# Patient Record
Sex: Female | Born: 1935 | Race: White | Hispanic: No | Smoking: Never smoker
Health system: Southern US, Community
[De-identification: ages and names within clinical notes are randomized; demographics above are authoritative.]

## PROBLEM LIST (undated history)

## (undated) DIAGNOSIS — G309 Alzheimer's disease, unspecified: Secondary | ICD-10-CM

## (undated) DIAGNOSIS — R630 Anorexia: Secondary | ICD-10-CM

## (undated) DIAGNOSIS — E785 Hyperlipidemia, unspecified: Secondary | ICD-10-CM

## (undated) DIAGNOSIS — F32A Depression, unspecified: Secondary | ICD-10-CM

## (undated) DIAGNOSIS — F419 Anxiety disorder, unspecified: Secondary | ICD-10-CM

## (undated) DIAGNOSIS — F0280 Dementia in other diseases classified elsewhere without behavioral disturbance: Secondary | ICD-10-CM

## (undated) DIAGNOSIS — I639 Cerebral infarction, unspecified: Secondary | ICD-10-CM

## (undated) DIAGNOSIS — E559 Vitamin D deficiency, unspecified: Secondary | ICD-10-CM

## (undated) DIAGNOSIS — D649 Anemia, unspecified: Secondary | ICD-10-CM

## (undated) DIAGNOSIS — K219 Gastro-esophageal reflux disease without esophagitis: Secondary | ICD-10-CM

## (undated) DIAGNOSIS — J449 Chronic obstructive pulmonary disease, unspecified: Secondary | ICD-10-CM

## (undated) DIAGNOSIS — M1990 Unspecified osteoarthritis, unspecified site: Secondary | ICD-10-CM

## (undated) DIAGNOSIS — N189 Chronic kidney disease, unspecified: Secondary | ICD-10-CM

## (undated) DIAGNOSIS — I10 Essential (primary) hypertension: Secondary | ICD-10-CM

## (undated) HISTORY — PX: KNEE SURGERY: SHX244

## (undated) HISTORY — DX: Anemia, unspecified: D64.9

## (undated) HISTORY — DX: Cerebral infarction, unspecified: I63.9

## (undated) HISTORY — DX: Chronic obstructive pulmonary disease, unspecified: J44.9

## (undated) HISTORY — PX: TONSILLECTOMY: SUR1361

## (undated) HISTORY — DX: Chronic kidney disease, unspecified: N18.9

## (undated) HISTORY — PX: ABDOMINAL HYSTERECTOMY: SHX81

---

## 2009-08-31 ENCOUNTER — Ambulatory Visit (HOSPITAL_COMMUNITY): Admission: RE | Admit: 2009-08-31 | Discharge: 2009-08-31 | Payer: Self-pay | Admitting: Unknown Physician Specialty

## 2011-08-22 ENCOUNTER — Emergency Department (HOSPITAL_COMMUNITY)
Admission: EM | Admit: 2011-08-22 | Discharge: 2011-08-22 | Disposition: A | Discharge status: Nursing Home (Includes ICF) | Payer: Medicare Other | Attending: Emergency Medicine | Admitting: Emergency Medicine

## 2011-08-22 ENCOUNTER — Emergency Department (HOSPITAL_COMMUNITY): Payer: Medicare Other

## 2011-08-22 DIAGNOSIS — I10 Essential (primary) hypertension: Secondary | ICD-10-CM | POA: Insufficient documentation

## 2011-08-22 DIAGNOSIS — G309 Alzheimer's disease, unspecified: Secondary | ICD-10-CM | POA: Insufficient documentation

## 2011-08-22 DIAGNOSIS — Y9210 Unspecified residential institution as the place of occurrence of the external cause: Secondary | ICD-10-CM | POA: Insufficient documentation

## 2011-08-22 DIAGNOSIS — W1809XA Striking against other object with subsequent fall, initial encounter: Secondary | ICD-10-CM | POA: Insufficient documentation

## 2011-08-22 DIAGNOSIS — W19XXXA Unspecified fall, initial encounter: Secondary | ICD-10-CM

## 2011-08-22 DIAGNOSIS — S0990XA Unspecified injury of head, initial encounter: Secondary | ICD-10-CM | POA: Insufficient documentation

## 2011-08-22 DIAGNOSIS — F0280 Dementia in other diseases classified elsewhere without behavioral disturbance: Secondary | ICD-10-CM | POA: Insufficient documentation

## 2011-08-22 HISTORY — DX: Alzheimer's disease, unspecified: G30.9

## 2011-08-22 HISTORY — DX: Anxiety disorder, unspecified: F41.9

## 2011-08-22 HISTORY — DX: Unspecified osteoarthritis, unspecified site: M19.90

## 2011-08-22 HISTORY — DX: Dementia in other diseases classified elsewhere, unspecified severity, without behavioral disturbance, psychotic disturbance, mood disturbance, and anxiety: F02.80

## 2011-08-22 HISTORY — DX: Hyperlipidemia, unspecified: E78.5

## 2011-08-22 HISTORY — DX: Essential (primary) hypertension: I10

## 2011-08-22 NOTE — ED Notes (Signed)
[facility] arrived. Pt to [place] in wheelchair

## 2011-08-22 NOTE — ED Notes (Signed)
Patient transported to CT 

## 2011-08-22 NOTE — ED Notes (Signed)
Pt dressed and in wheelchair, to desk to await ride to [facility]

## 2011-08-22 NOTE — ED Provider Notes (Signed)
History     CSN: [id number]  Arrival date & time 08/22/11  1821   First MD Initiated Contact with Patient 08/22/11 1828      Chief Complaint  Patient presents with  . Fall    (Consider location/radiation/quality/duration/timing/severity/associated sxs/prior treatment) Patient is a 78 y.o. female presenting with fall. The history is provided by the patient, the nursing home and the EMS personnel.  Fall The accident occurred 1 to 2 hours ago. The fall occurred while standing. The point of impact was the head. The pain is at a severity of 0/10. The patient is experiencing no pain. Pertinent negatives include no nausea and no vomiting.   Patient from Alzheimer's unit at [facility]. Unwitnessed fall the patient claimed that she fell backwards striking wall and slid to the ground and hit her head on the back of the wall. No complaints now.   Level V caveat applies due to her history of severe Alzheimer.  Past Medical History  Diagnosis Date  . Alzheimer disease   . Hypertension   . Anxiety   . Hyperlipidemia   . Osteoarthritis   . Allergic rhinitis   . Osteoporosis     Past Surgical History  Procedure Date  . Unable     No family history on file.  History  Substance Use Topics  . Smoking status: Not on file  . Smokeless tobacco: Not on file  . Alcohol Use: No    OB History    Grav Para Term Preterm Abortions TAB SAB Ect Mult Living                  Review of Systems  Unable to perform ROS Gastrointestinal: Negative for nausea and vomiting.   otherwise not able to obtain review of systems due to severe Alzheimer's.  Allergies  Review of patient's allergies indicates no known allergies.  Home Medications  No current outpatient prescriptions on file.  BP 129/88  Pulse 91  Temp(Src) 98.7 F (37.1 C) (Oral)  Resp 20  SpO2 99%  Physical Exam  Nursing note and vitals reviewed. Constitutional: She appears well-developed and well-nourished. No distress.    HENT:  Head: Normocephalic and atraumatic.  Mouth/Throat: Oropharynx is clear and moist.  Eyes: Conjunctivae are normal. Pupils are equal, round, and reactive to light.  Neck: Normal range of motion. Neck supple.  Cardiovascular: Normal rate, regular rhythm and normal heart sounds.   No murmur heard. Pulmonary/Chest: Effort normal and breath sounds normal. She has no wheezes.  Abdominal: Soft. Bowel sounds are normal. There is no tenderness.  Musculoskeletal: Normal range of motion. She exhibits no tenderness.  Neurological: She is alert.       Moving all 4 extremities  Skin: Skin is warm. No rash noted.    ED Course  Procedures (including critical care time)  Labs Reviewed - No data to display Ct Head Wo Contrast  08/22/2011  *RADIOLOGY REPORT*  Clinical Data: Fall  CT HEAD WITHOUT CONTRAST  Technique:  Contiguous axial images were obtained from the base of the skull through the vertex without contrast.  Comparison: None.  Findings: No acute intracranial hemorrhage.  No focal mass lesion. No CT evidence of acute infarction.   No midline shift or mass effect.  No hydrocephalus.  Basilar cisterns are patent.  There is generalized cortical atrophy and periventricular white matter hypodensities.  No evidence of skull fracture.  No fluid the paranasal sinuses.  IMPRESSION:  1.  No acute intracranial trauma. 2.  Atrophy and microvascular disease.  Original Report Authenticated By: Genevive Bi, M.D.     1. Fall   2. Head injury       MDM  Patient from [facility] brought in by EMS on a witnessed fall patient's stay she fell against the back wall and slid down and hit head against the wall, patient is in the Alzheimer's unit, no obvious trauma. Head CT negative. Okay to return to [facility].        Shelda Jakes, MD 08/22/11 2034

## 2011-08-22 NOTE — ED Notes (Signed)
Report called to [place] at [facility].  awating transport

## 2011-08-22 NOTE — ED Notes (Signed)
Pt from [facility] brought by EMS for ? un witnessed fall. Per pt she fell against back of wall and slid down and hit head against wall. Pt is not is LSB or C-collar. Pt alert. Pt lives on [facility].

## 2011-09-26 ENCOUNTER — Emergency Department (HOSPITAL_COMMUNITY)
Admission: EM | Admit: 2011-09-26 | Discharge: 2011-09-27 | Disposition: A | Discharge status: Nursing Home (Includes ICF) | Payer: Medicare Other | Attending: Emergency Medicine | Admitting: Emergency Medicine

## 2011-09-26 ENCOUNTER — Encounter (HOSPITAL_COMMUNITY): Payer: Self-pay | Admitting: *Deleted

## 2011-09-26 DIAGNOSIS — G309 Alzheimer's disease, unspecified: Secondary | ICD-10-CM | POA: Insufficient documentation

## 2011-09-26 DIAGNOSIS — M1990 Unspecified osteoarthritis, unspecified site: Secondary | ICD-10-CM | POA: Insufficient documentation

## 2011-09-26 DIAGNOSIS — S0990XA Unspecified injury of head, initial encounter: Secondary | ICD-10-CM

## 2011-09-26 DIAGNOSIS — F0390 Unspecified dementia without behavioral disturbance: Secondary | ICD-10-CM | POA: Insufficient documentation

## 2011-09-26 DIAGNOSIS — S01311A Laceration without foreign body of right ear, initial encounter: Secondary | ICD-10-CM

## 2011-09-26 DIAGNOSIS — W19XXXA Unspecified fall, initial encounter: Secondary | ICD-10-CM | POA: Insufficient documentation

## 2011-09-26 DIAGNOSIS — M810 Age-related osteoporosis without current pathological fracture: Secondary | ICD-10-CM | POA: Insufficient documentation

## 2011-09-26 DIAGNOSIS — S01309A Unspecified open wound of unspecified ear, initial encounter: Secondary | ICD-10-CM | POA: Insufficient documentation

## 2011-09-26 DIAGNOSIS — R4182 Altered mental status, unspecified: Secondary | ICD-10-CM | POA: Insufficient documentation

## 2011-09-26 DIAGNOSIS — I10 Essential (primary) hypertension: Secondary | ICD-10-CM | POA: Insufficient documentation

## 2011-09-26 DIAGNOSIS — F411 Generalized anxiety disorder: Secondary | ICD-10-CM | POA: Insufficient documentation

## 2011-09-26 DIAGNOSIS — Y9210 Unspecified residential institution as the place of occurrence of the external cause: Secondary | ICD-10-CM | POA: Insufficient documentation

## 2011-09-26 DIAGNOSIS — S0003XA Contusion of scalp, initial encounter: Secondary | ICD-10-CM | POA: Insufficient documentation

## 2011-09-26 DIAGNOSIS — F0280 Dementia in other diseases classified elsewhere without behavioral disturbance: Secondary | ICD-10-CM | POA: Insufficient documentation

## 2011-09-26 DIAGNOSIS — E785 Hyperlipidemia, unspecified: Secondary | ICD-10-CM | POA: Insufficient documentation

## 2011-09-26 NOTE — ED Notes (Signed)
Per EMS - pt from [facility] - pt reported to have an unwitnessed fall this evening - pt w/ rt ear laceration w/ unknown mechanism of injury - pt w/ hx of dementia and is a poor historian to events. Pt alert on arrival in no acute distress and denies any pain at present.

## 2011-09-27 ENCOUNTER — Emergency Department (HOSPITAL_COMMUNITY): Payer: Medicare Other

## 2011-09-27 LAB — URINALYSIS, ROUTINE W REFLEX MICROSCOPIC
Glucose, UA: NEGATIVE mg/dL
Urobilinogen, UA: 0.2 mg/dL (ref 0.0–1.0)

## 2011-09-27 LAB — URINE MICROSCOPIC-ADD ON

## 2011-09-27 NOTE — ED Provider Notes (Signed)
History     CSN: [id number]  Arrival date & time 09/26/11  2222   First MD Initiated Contact with Patient 09/26/11 2350      Chief Complaint  Patient presents with  . Fall  . Ear Laceration     Patient is a 78 y.o. female presenting with fall. The history is provided by the patient, the nursing home and the EMS personnel. History Limited By: dementia.  Fall   patient presents from the assisted living facility after having a reported fall.  She was sent because of a laceration to her right year.  The patient has a history of dementia however the patient reports to me that she tripped over the side of her bed and struck her right year.  She denies headache.  She denies weakness of her arms or her legs.  She denies neck pain.  At this time she is without complaints.  She does not even complain of pain in her right year.  Past Medical History  Diagnosis Date  . Alzheimer disease   . Hypertension   . Anxiety   . Hyperlipidemia   . Osteoarthritis   . Allergic rhinitis   . Osteoporosis    Medications acetaminophen (QC ARTHRITIS PAIN RELIEF) 650 MG CR tablet Take 650 mg by mouth 3 (three) times daily as needed. For pain Historical Provider, MD aspirin EC 81 MG tablet Take 81 mg by mouth daily. Historical Provider, MD atorvastatin (LIPITOR) 10 MG tablet Take 10 mg by mouth daily. Historical Provider, MD Calcium Citrate-Vitamin D 250-200 MG-UNIT TABS Take 2 tablets by mouth 3 (three) times daily. Historical Provider, MD donepezil (ARICEPT) 10 MG tablet Take 10 mg by mouth at bedtime as needed. Historical Provider, MD lisinopril (PRINIVIL,ZESTRIL) 40 MG tablet Take 40 mg by mouth daily. Historical Provider, MD LORazepam (ATIVAN) 1 MG tablet Take 0.5 mg by mouth at bedtime. Historical Provider, MD Lycopene 10 MG CAPS Take 1 capsule by mouth daily. Historical Provider, MD mirtazapine (REMERON) 15 MG tablet Take 15 mg by mouth at bedtime. Historical Provider, MD risperiDONE (RISPERDAL) 0.5 MG tablet  Take 0.5 mg by mouth at bedtime. Historical Provider, MD  Past Surgical History  Procedure Date  . Unable     No family history on file.  History  Substance Use Topics  . Smoking status: Unknown If Ever Smoked  . Smokeless tobacco: Not on file  . Alcohol Use: No    OB History    Grav Para Term Preterm Abortions TAB SAB Ect Mult Living                  Review of Systems  Unable to perform ROS   Allergies  Review of patient's allergies indicates no known allergies.  Home Medications     BP 144/76  Pulse 83  Temp(Src) 98.8 F (37.1 C) (Oral)  Resp 16  Ht 5' (1.524 m)  Wt 125 lb (56.7 kg)  BMI 24.41 kg/m2  SpO2 93%  Physical Exam  Nursing note and vitals reviewed. Constitutional: She appears well-developed and well-nourished. No distress.  HENT:  Head: Normocephalic and atraumatic.       Laceration to right superior helix and antihelix both on the anterior and posterior aspect.  This does not appear to be a through and through laceration.  There is no active bleeding.  There is a small hematoma of the superior anterior area of the antihelix and scaphoid fossa.  Her right external ear is otherwise normal.  She has  no mastoid tenderness or bruising.  Eyes: EOM are normal.  Neck: Normal range of motion. Neck supple.  Cardiovascular: Normal rate, regular rhythm and normal heart sounds.   Pulmonary/Chest: Effort normal and breath sounds normal.  Abdominal: Soft. She exhibits no distension. There is no tenderness.  Musculoskeletal: Normal range of motion.  Neurological: She is alert.       Oriented to person and place.  She does not know the year nor the month  Skin: Skin is warm and dry.  Psychiatric: She has a normal mood and affect. Judgment normal.    ED Course  Procedures (including critical care time)  LACERATION REPAIR Performed by: Lyanne Co Consent: Verbal consent obtained. Risks and benefits: risks, benefits and alternatives were  discussed Patient identity confirmed: provided demographic data Time out performed prior to procedure Prepped and Draped in normal sterile fashion Wound explored  Laceration Location: Right external ear Laceration Length: 3 cm No Foreign Bodies seen or palpated Anesthesia: None  Irrigation method: syringe Amount of cleaning: standard Skin closure: Tissue adhesive  Technique: Tissue adhesive  Patient tolerance: Patient tolerated the procedure well with no immediate complications.   Labs Reviewed  URINALYSIS, ROUTINE W REFLEX MICROSCOPIC - Abnormal; Notable for the following:    Hgb urine dipstick TRACE (*)    Leukocytes, UA TRACE (*)    All other components within normal limits  URINE MICROSCOPIC-ADD ON - Abnormal; Notable for the following:    Bacteria, UA FEW (*)    Casts HYALINE CASTS (*) GRANULAR CAST   All other components within normal limits  URINE CULTURE   Ct Head Wo Contrast  09/27/2011  *RADIOLOGY REPORT*  Clinical Data: Unwitnessed fall at the nursing home.  Laceration to the right ear.  History of Alzheimer's disease.  CT HEAD WITHOUT CONTRAST  Technique:  Contiguous axial images were obtained from the base of the skull through the vertex without contrast.  Comparison: CT head without contrast 08/22/2011.  Findings: Atrophy and moderate white matter disease is stable.  No acute cortical infarct, hemorrhage, mass lesion is present.  Remote lacunar infarcts are present within the cerebellum bilaterally. The ventricles are of normal size.  No significant extra-axial fluid collection is present.  The paranasal sinuses and mastoid air cells are clear.  The osseous skull is intact.  No significant extracranial soft tissue injury is evident.  IMPRESSION:  1.  Stable atrophy and white matter disease. 2.  No acute intracranial abnormality.  Original Report Authenticated By: Jamesetta Orleans. MATTERN, M.D.     1. Fall   2. Laceration of right external ear   3. Minor head injury        MDM  Patient with laceration to her right ear.  This was repaired with Dermabond.  There is a small hematoma of the right auricle however given her history of dementia and her age I don't think she will tolerate significant dressing well.  She is at slight increased risk for deformity of her right year however I think that the risk of placing a bolster dressing is greater than the benefit.  Head CT negative.  Urinalysis normal.  DC home in good condition.        Lyanne Co, MD 09/27/11 [id number]

## 2011-09-27 NOTE — ED Notes (Signed)
Called [phone] and notified of pt discharge. Holly at [facility] will be here shortly to pick pt up.

## 2011-09-27 NOTE — Discharge Instructions (Signed)
Auricle Injuries You have an injury to your external ear (auricle). The ear has a layer of skin over cartilage. A cut or bruise to the ear can separate the skin from the cartilage underneath. This can cause problems with healing if blood gathers between the skin and the cartilage. Permanent damage to the ear may result if the excess blood is not drained within 1 to 2 days. Stitches, tape, or tissue glue may be used to close a cut. A pressure bandage may be used to keep blood from forming under the injured skin. If there is a lot of blood present (hematoma), a needle aspiration may be needed to remove it. You must have the ear checked within 1 to 2 days or as directed if you have had this type of injury. This is see if the blood has accumulated again. Call your caregiver for a follow-up exam as recommended.  SEEK IMMEDIATE MEDICAL CARE IF:  You develop severe pain.   You develop a fever or pus like drainage.   You have increased hearing loss or other problems.  MAKE SURE YOU:   Understand these instructions.   Will watch your condition.   Will get help right away if you are not doing well or get worse.  Document Released: 08/01/2005 Document Revised: 04/13/2011 Document Reviewed: 01/18/2007 [facility] Patient Information 2012 [place], [place].

## 2011-09-27 NOTE — ED Notes (Signed)
Discharge instructions reviewed w/ Fleet Contras, pt's caregiver from [facility] Denies any further questions or concerns at present.

## 2011-09-29 LAB — URINE CULTURE
Colony Count: >=100000
Culture  Setup Time: 201302121438

## 2011-09-30 NOTE — ED Notes (Signed)
+   Urine Chart sent to EDP office for review; no discharge orders

## 2011-10-03 NOTE — ED Notes (Signed)
Chart returned from EDP office. Prescribed Macrobid 100 mg BID X 5 days. Prescribed by Levy Sjogren NP.

## 2011-10-05 NOTE — ED Notes (Signed)
Phone number not in service. Letter sent to EDP address.

## 2011-11-19 ENCOUNTER — Other Ambulatory Visit: Payer: Self-pay

## 2011-11-19 ENCOUNTER — Emergency Department (HOSPITAL_COMMUNITY): Payer: Medicare Other

## 2011-11-19 ENCOUNTER — Inpatient Hospital Stay (HOSPITAL_COMMUNITY)
Admission: EM | Admit: 2011-11-19 | Discharge: 2011-11-20 | DRG: 684 | Disposition: A | Discharge status: Skilled Nursing Facility | Payer: Medicare Other | Attending: Internal Medicine | Admitting: Internal Medicine

## 2011-11-19 DIAGNOSIS — N179 Acute kidney failure, unspecified: Principal | ICD-10-CM | POA: Diagnosis present

## 2011-11-19 DIAGNOSIS — R079 Chest pain, unspecified: Secondary | ICD-10-CM | POA: Diagnosis present

## 2011-11-19 DIAGNOSIS — M1990 Unspecified osteoarthritis, unspecified site: Secondary | ICD-10-CM | POA: Diagnosis present

## 2011-11-19 DIAGNOSIS — F0280 Dementia in other diseases classified elsewhere without behavioral disturbance: Secondary | ICD-10-CM | POA: Diagnosis present

## 2011-11-19 DIAGNOSIS — G309 Alzheimer's disease, unspecified: Secondary | ICD-10-CM | POA: Diagnosis present

## 2011-11-19 DIAGNOSIS — M810 Age-related osteoporosis without current pathological fracture: Secondary | ICD-10-CM | POA: Diagnosis present

## 2011-11-19 DIAGNOSIS — I10 Essential (primary) hypertension: Secondary | ICD-10-CM | POA: Diagnosis present

## 2011-11-19 DIAGNOSIS — E785 Hyperlipidemia, unspecified: Secondary | ICD-10-CM | POA: Diagnosis present

## 2011-11-19 DIAGNOSIS — E860 Dehydration: Secondary | ICD-10-CM | POA: Diagnosis present

## 2011-11-19 LAB — DIFFERENTIAL
Eosinophils Absolute: 0.2 10*3/uL (ref 0.0–0.7)
Lymphocytes Relative: 28 % (ref 12–46)
Lymphs Abs: 2.2 10*3/uL (ref 0.7–4.0)
Monocytes Relative: 9 % (ref 3–12)
Neutrophils Relative %: 61 % (ref 43–77)

## 2011-11-19 LAB — PROTIME-INR: INR: 1.16 (ref 0.00–1.49)

## 2011-11-19 LAB — CBC
Hemoglobin: 9.7 g/dL — ABNORMAL LOW (ref 12.0–15.0)
MCH: 31.3 pg (ref 26.0–34.0)
MCHC: 31.9 g/dL (ref 30.0–36.0)
RBC: 3.1 MIL/uL — ABNORMAL LOW (ref 3.87–5.11)
RDW: 15.7 % — ABNORMAL HIGH (ref 11.5–15.5)
WBC: 8.1 10*3/uL (ref 4.0–10.5)

## 2011-11-19 LAB — CARDIAC PANEL(CRET KIN+CKTOT+MB+TROPI): CK, MB: 4 ng/mL (ref 0.3–4.0)

## 2011-11-19 LAB — COMPREHENSIVE METABOLIC PANEL
ALT: 12 U/L (ref 0–35)
Chloride: 103 mEq/L (ref 96–112)
Creatinine, Ser: 2.13 mg/dL — ABNORMAL HIGH (ref 0.50–1.10)
GFR calc Af Amer: 23 mL/min — ABNORMAL LOW (ref >90)
GFR calc non Af Amer: 20 mL/min — ABNORMAL LOW (ref >90)
Total Bilirubin: 0.1 mg/dL — ABNORMAL LOW (ref 0.3–1.2)
Total Protein: 7 g/dL (ref 6.0–8.3)

## 2011-11-19 LAB — D-DIMER, QUANTITATIVE: D-Dimer, Quant: 2.6 ug/mL-FEU — ABNORMAL HIGH (ref 0.00–0.48)

## 2011-11-19 MED ORDER — SODIUM CHLORIDE 0.9 % IV BOLUS (SEPSIS)
500.0000 mL | Freq: Once | INTRAVENOUS | Status: AC
  Administered 2011-11-19: 500 mL via INTRAVENOUS

## 2011-11-19 MED ORDER — SODIUM CHLORIDE 0.9 % IV SOLN
Freq: Once | INTRAVENOUS | Status: AC
  Administered 2011-11-19: 22:00:00 via INTRAVENOUS

## 2011-11-19 NOTE — H&P (Addendum)
PCP:   Almond Lint M.D.   CODE STATUS:  DO NOT RESUSCITATE   Chief Complaint:  Chest pain this evening  HPI: Joanna Mendez is an 78 y.o. female.  Elderly demented Caucasian lady a resident of [facility] assisted [facility] facility, was brought to the emergency room with a history of chest pain lasting about 15 minutes. Reportedly complained of chest pain at the facility and also to EMS, but currently denies any chest pain or any history of chest pain. However because of the patient's dementia the history is not reliable.  [name] that she chest pain was apparently left-sided and radiated through to her back; there is no record of fever dizziness,diaphoresis, cough, cold, nausea or vomiting.  She has no history of kidney disease does have a history of hypertension and hyperlipidemia. Her medications include an ACE inhibitor but there is no record of her diuretic. She denies being thirsty.  A d-dimer was done because of her complaint of chest pain, and it is elevated and there is some concern for pulmonary embolus.   Rewiew of Systems:  Review of systems is unreliable because of the patient's dementia.  The patient denies anorexia, fever, weight loss,, vision loss, decreased hearing, hoarseness, chest pain, syncope, dyspnea on exertion, peripheral edema, balance deficits, hemoptysis, abdominal pain, melena, hematochezia, severe indigestion/heartburn, hematuria, genital sores, muscle weakness, suspicious skin lesions, transient blindness, difficulty walking, depression, unusual weight change, abnormal bleeding, enlarged lymph nodes, angioedema, and breast masses.    Past Medical History  Diagnosis Date  . Alzheimer disease   . Hypertension   . Anxiety   . Hyperlipidemia   . Osteoarthritis   . Allergic rhinitis   . Osteoporosis     Past Surgical History  Procedure Date  . Unable     Medications:  HOME MEDS: Prior to Admission medications   Medication Sig Start Date End Date  Taking? Authorizing Provider  acetaminophen (QC ARTHRITIS PAIN RELIEF) 650 MG CR tablet Take 650 mg by mouth 3 (three) times daily as needed. For pain    Historical Provider, MD  aspirin EC 81 MG tablet Take 81 mg by mouth daily.    Historical Provider, MD  atorvastatin (LIPITOR) 10 MG tablet Take 10 mg by mouth daily.    Historical Provider, MD  Calcium Citrate-Vitamin D 250-200 MG-UNIT TABS Take 2 tablets by mouth 3 (three) times daily.    Historical Provider, MD  donepezil (ARICEPT) 10 MG tablet Take 10 mg by mouth at bedtime as needed.    Historical Provider, MD  lisinopril (PRINIVIL,ZESTRIL) 40 MG tablet Take 40 mg by mouth daily.    Historical Provider, MD  LORazepam (ATIVAN) 1 MG tablet Take 0.5 mg by mouth at bedtime.    Historical Provider, MD  Lycopene 10 MG CAPS Take 1 capsule by mouth daily.    Historical Provider, MD  mirtazapine (REMERON) 15 MG tablet Take 15 mg by mouth at bedtime.    Historical Provider, MD  risperiDONE (RISPERDAL) 0.5 MG tablet Take 0.5 mg by mouth at bedtime.    Historical Provider, MD  UNABLE TO FIND Take 1 Can by mouth 3 (three) times daily. Med Name: MIGHTY SHAKE    Historical Provider, MD     Allergies:  No Known Allergies  Social History:   has an unknown smoking status. She does not have any smokeless tobacco history on file. She reports that she does not drink alcohol or use illicit drugs.  Family History: No family history on file.  Unable to obtain due to patient's dementia  Physical Exam: Filed Vitals:   11/19/11 2008 11/19/11 2100  BP: 125/77 122/77  Pulse: 93 82  Temp: 98.7 F (37.1 C)   TempSrc: Oral   Resp: 16 19  Height: 5\' 4"  (1.626 m)   Weight: 58.968 kg (130 lb)   SpO2: 96% 97%   Blood pressure 122/77, pulse 82, temperature 98.7 F (37.1 C), temperature source Oral, resp. rate 19, height 5\' 4"  (1.626 m), weight 58.968 kg (130 lb), SpO2 97.00%.  GEN: Pleasantly demented elderly Caucasian lady lying in the stretcher in no  acute distress; cooperative with exam PSYCH:  alert and oriented x1; does not appear anxious or depressed; affect is appropriate. HEENT: Mucous membranes pink and anicteric; PERRLA; EOM intact; no cervical lymphadenopathy nor thyromegaly or ; no JVD; Breasts:: Not examined CHEST WALL: No tenderness CHEST: Normal respiration, clear to auscultation bilaterally HEART: Regular rate and rhythm; no murmurs rubs or gallops BACK: no CVA tenderness ABDOMEN: soft non-tender; no masses, no organomegaly, normal abdominal bowel sounds; n no intertriginous candida. Rectal Exam: Not done EXTREMITIES:  age-appropriate arthropathy of the hands and knees; no edema; no ulcerations. Genitalia: not examined PULSES: 2+ and symmetric SKIN: Normal hydration no rash or ulceration CNS: Cranial nerves 2-12 grossly intact no focal lateralizing neurologic deficit   Labs & Imaging Results for orders placed during the hospital encounter of 11/19/11 (from the past 48 hour(s))  CARDIAC PANEL(CRET KIN+CKTOT+MB+TROPI)     Status: Normal   Collection Time   11/19/11  8:21 PM      Component Value Range Comment   Total CK 87  7 - 177 (U/L)    CK, MB 4.0  0.3 - 4.0 (ng/mL)    Troponin I <0.30  <0.30 (ng/mL)    Relative Index RELATIVE INDEX IS INVALID  0.0 - 2.5    CBC     Status: Abnormal   Collection Time   11/19/11  8:21 PM      Component Value Range Comment   WBC 8.1  4.0 - 10.5 (K/uL)    RBC 3.10 (*) 3.87 - 5.11 (MIL/uL)    Hemoglobin 9.7 (*) 12.0 - 15.0 (g/dL)    HCT 98.1 (*) 19.1 - 46.0 (%)    MCV 98.1  78.0 - 100.0 (fL)    MCH 31.3  26.0 - 34.0 (pg)    MCHC 31.9  30.0 - 36.0 (g/dL)    RDW 47.8 (*) 29.5 - 15.5 (%)    Platelets 307  150 - 400 (K/uL)   DIFFERENTIAL     Status: Normal   Collection Time   11/19/11  8:21 PM      Component Value Range Comment   Neutrophils Relative 61  43 - 77 (%)    Neutro Abs 4.9  1.7 - 7.7 (K/uL)    Lymphocytes Relative 28  12 - 46 (%)    Lymphs Abs 2.2  0.7 - 4.0 (K/uL)     Monocytes Relative 9  3 - 12 (%)    Monocytes Absolute 0.7  0.1 - 1.0 (K/uL)    Eosinophils Relative 2  0 - 5 (%)    Eosinophils Absolute 0.2  0.0 - 0.7 (K/uL)    Basophils Relative 0  0 - 1 (%)    Basophils Absolute 0.0  0.0 - 0.1 (K/uL)   COMPREHENSIVE METABOLIC PANEL     Status: Abnormal   Collection Time   11/19/11  8:21 PM  Component Value Range Comment   Sodium 139  135 - 145 (mEq/L)    Potassium 4.7  3.5 - 5.1 (mEq/L)    Chloride 103  96 - 112 (mEq/L)    CO2 26  19 - 32 (mEq/L)    Glucose, Bld 97  70 - 99 (mg/dL)    BUN 42 (*) 6 - 23 (mg/dL)    Creatinine, Ser 5.78 (*) 0.50 - 1.10 (mg/dL)    Calcium 46.9 (*) 8.4 - 10.5 (mg/dL)    Total Protein 7.0  6.0 - 8.3 (g/dL)    Albumin 3.3 (*) 3.5 - 5.2 (g/dL)    AST 21  0 - 37 (U/L)    ALT 12  0 - 35 (U/L)    Alkaline Phosphatase 65  39 - 117 (U/L)    Total Bilirubin 0.1 (*) 0.3 - 1.2 (mg/dL)    GFR calc non Af Amer 20 (*) >90 (mL/min)    GFR calc Af Amer 23 (*) >90 (mL/min)   PROTIME-INR     Status: Normal   Collection Time   11/19/11  8:21 PM      Component Value Range Comment   Prothrombin Time 15.0  11.6 - 15.2 (seconds)    INR 1.16  0.00 - 1.49    D-DIMER, QUANTITATIVE     Status: Abnormal   Collection Time   11/19/11  8:21 PM      Component Value Range Comment   D-Dimer, Quant 2.60 (*) 0.00 - 0.48 (ug/mL-FEU)   LIPASE, BLOOD     Status: Abnormal   Collection Time   11/19/11  8:21 PM      Component Value Range Comment   Lipase 75 (*) 11 - 59 (U/L)    Dg Chest Portable 1 View  11/19/2011  *RADIOLOGY REPORT*  Clinical Data: Chest pain  PORTABLE CHEST - 1 VIEW  Comparison: None.  Findings: Heart size upper normal.  Aorta is uncoiled and atherosclerotic.  Negative for heart failure.  Lungs are clear without infiltrate or mass.  IMPRESSION: No active cardiopulmonary disease.  Original Report Authenticated By: Camelia Phenes, M.D.    EKG shows Q waves in leads 3, and large S waves in aVF, and marked left axis deviation.     Assessment Present on Admission:  .Chest pain, by her report  .Acute renal failure, resumed from history possibility of chronic kidney disease  .HTN (hypertension) .Hyperlipidemia .Dementia   PLAN:  We'll admit this lady her hydration and monitor her renal function Check FENa Discontinue lisinopril for the time being and institute when necessary blood pressure medication  The lack of tachycardia hypoxia and hypotension [place] embolus unlikely.  Will cycle her cardiac enzymes and have when necessary nitrates available in case of chest pain  If acute cardiac disease is ruled out, and patient's renal function is improving, she can likely be discharged home for outpatient management.   Other plans as per orders.   Karen Huhta 11/19/2011, 10:22 PM

## 2011-11-19 NOTE — ED Notes (Signed)
Patient was complaining of chest pain at nursing home, also complained of central chest pain and pressure to ems. Patient has history of Alzheimers. Received four asa pta.

## 2011-11-19 NOTE — ED Provider Notes (Signed)
History    Scribed for Glynn Octave, MD, the patient was seen in room APA08/APA08. This chart was scribed by Katha Cabal.   CSN: [id number]  Arrival date & time 11/19/11  2004   First MD Initiated Contact with Patient 11/19/11 2015      Chief Complaint  Patient presents with  . Chest Pain    (Consider location/radiation/quality/duration/timing/severity/associated sxs/prior treatment) HPI  Pt was seen at 8:19 PM  Level V Caveat applies for dementia.    Joanna Mendez is a 78 y.o. female who presents to the Emergency Department complaining of substernal chest pain today.  Pain lasted about 15 minutes. No chest pain currently.  Pain does not radiate to back.  Patient was resting when pain started.  Denies vomiting, diaphoresis or shortness of breath.   Patient brought in by EMS from SNF.  Patient took 4 ASA prior to arrival.  Symptoms are not associated with abdominal pain or back pain.   Patient with history of Alzheimers disease , hypertension and hyperlipidemia.      PCP No primary provider on file.      Past Medical History  Diagnosis Date  . Alzheimer disease   . Hypertension   . Anxiety   . Hyperlipidemia   . Osteoarthritis   . Allergic rhinitis   . Osteoporosis     Past Surgical History  Procedure Date  . Unable     No family history on file.  History  Substance Use Topics  . Smoking status: Unknown If Ever Smoked  . Smokeless tobacco: Not on file  . Alcohol Use: No    OB History    Grav Para Term Preterm Abortions TAB SAB Ect Mult Living                  Review of Systems  Unable to perform ROS: Dementia    Allergies  Review of patient's allergies indicates no known allergies.  Home Medications     BP 134/71  Pulse 71  Temp(Src) 98.7 F (37.1 C) (Oral)  Resp 17  Ht 5\' 4"  (1.626 m)  Wt 130 lb (58.968 kg)  BMI 22.31 kg/m2  SpO2 94%  Physical Exam  Nursing note and vitals reviewed. Constitutional: She is oriented to person,  place, and time. She appears well-developed. No distress.  HENT:  Head: Normocephalic and atraumatic.  Eyes: Conjunctivae and EOM are normal.  Neck: Neck supple.  Cardiovascular: Normal rate, regular rhythm and normal heart sounds.   Pulmonary/Chest: Effort normal. No respiratory distress. She exhibits tenderness.       reproducible chest wall pain  Abdominal: Soft. There is no tenderness. There is no rebound and no guarding.  Musculoskeletal: Normal range of motion. She exhibits no edema.  Neurological: She is alert and oriented to person, place, and time. No sensory deficit. Coordination normal.  Skin: Skin is warm and dry. No rash noted.  Psychiatric: She has a normal mood and affect. Her behavior is normal.    ED Course  Procedures (including critical care time)   DIAGNOSTIC STUDIES: Oxygen Saturation is 96% on room air, normal by my interpretation.     COORDINATION OF CARE: 8:23 PM  Physical exam complete.   9:47 PM  Consulted with Hospitalist concerning patient labs.  D-Dimer 2.60   Plan to admit patient.    LABS / RADIOLOGY:   Labs Reviewed  CBC - Abnormal; Notable for the following:    RBC 3.10 (*)    Hemoglobin 9.7 (*)  HCT 30.4 (*)    RDW 15.7 (*)    All other components within normal limits  COMPREHENSIVE METABOLIC PANEL - Abnormal; Notable for the following:    BUN 42 (*)    Creatinine, Ser 2.13 (*)    Calcium 10.7 (*)    Albumin 3.3 (*)    Total Bilirubin 0.1 (*)    GFR calc non Af Amer 20 (*)    GFR calc Af Amer 23 (*)    All other components within normal limits  D-DIMER, QUANTITATIVE - Abnormal; Notable for the following:    D-Dimer, Quant 2.60 (*)    All other components within normal limits  LIPASE, BLOOD - Abnormal; Notable for the following:    Lipase 75 (*)    All other components within normal limits  CARDIAC PANEL(CRET KIN+CKTOT+MB+TROPI)  DIFFERENTIAL  PROTIME-INR  URINALYSIS, ROUTINE W REFLEX MICROSCOPIC   Dg Chest Portable 1  View  11/19/2011  *RADIOLOGY REPORT*  Clinical Data: Chest pain  PORTABLE CHEST - 1 VIEW  Comparison: None.  Findings: Heart size upper normal.  Aorta is uncoiled and atherosclerotic.  Negative for heart failure.  Lungs are clear without infiltrate or mass.  IMPRESSION: No active cardiopulmonary disease.  Original Report Authenticated By: Camelia Phenes, M.D.         MDM  Left sided reproducible chest pain and pressure. Relieved with aspirin.  Labs that is remarkable for anemia, elevated creatinine, elevated d-dimer. Chest Xray normal.  Chest pain now resolved.  Patient is a poor historian and while her chest pain appears to be coming from her chest wall, she cannot have a VQ scan tonight. Her renal failure precludes a CT angiogram of her chest.  Will admit for VQ scan in the morning, serial troponins.   Date: 11/19/2011  Rate: 87  Rhythm: none available  QRS Axis: left  Intervals: normal  ST/T Wave abnormalities: normal  Conduction Disutrbances:none  Narrative Interpretation: Inferior Q waves  Old EKG Reviewed: none available            MEDICATIONS GIVEN IN THE E.D. Scheduled Meds:    . sodium chloride   Intravenous Once  . sodium chloride  500 mL Intravenous Once   Continuous Infusions:      IMPRESSION: 1. Chest pain      NEW MEDICATIONS: New Prescriptions   No medications on file      I personally performed the services described in this documentation, which was scribed in my presence.  The recorded information has been reviewed and considered.     Glynn Octave, MD 11/20/11 0005

## 2011-11-20 ENCOUNTER — Encounter (HOSPITAL_COMMUNITY): Payer: Self-pay | Admitting: *Deleted

## 2011-11-20 LAB — COMPREHENSIVE METABOLIC PANEL
AST: 17 U/L (ref 0–37)
BUN: 37 mg/dL — ABNORMAL HIGH (ref 6–23)
CO2: 24 mEq/L (ref 19–32)
Chloride: 110 mEq/L (ref 96–112)
Creatinine, Ser: 1.94 mg/dL — ABNORMAL HIGH (ref 0.50–1.10)
GFR calc Af Amer: 26 mL/min — ABNORMAL LOW (ref >90)
GFR calc non Af Amer: 22 mL/min — ABNORMAL LOW (ref >90)
Glucose, Bld: 87 mg/dL (ref 70–99)
Total Bilirubin: 0.1 mg/dL — ABNORMAL LOW (ref 0.3–1.2)

## 2011-11-20 LAB — CBC
HCT: 29 % — ABNORMAL LOW (ref 36.0–46.0)
Hemoglobin: 9 g/dL — ABNORMAL LOW (ref 12.0–15.0)
MCV: 99 fL (ref 78.0–100.0)
Platelets: 288 10*3/uL (ref 150–400)
RBC: 2.93 MIL/uL — ABNORMAL LOW (ref 3.87–5.11)
WBC: 8.5 10*3/uL (ref 4.0–10.5)

## 2011-11-20 LAB — URINALYSIS, ROUTINE W REFLEX MICROSCOPIC
Bilirubin Urine: NEGATIVE
Ketones, ur: NEGATIVE mg/dL
Nitrite: NEGATIVE

## 2011-11-20 LAB — CARDIAC PANEL(CRET KIN+CKTOT+MB+TROPI)
CK, MB: 3.1 ng/mL (ref 0.3–4.0)
Total CK: 83 U/L (ref 7–177)
Troponin I: <0.3 ng/mL (ref <0.30)

## 2011-11-20 LAB — SODIUM, URINE, RANDOM: Sodium, Ur: 86 mEq/L

## 2011-11-20 LAB — URINE MICROSCOPIC-ADD ON

## 2011-11-20 LAB — MAGNESIUM: Magnesium: 1.9 mg/dL (ref 1.5–2.5)

## 2011-11-20 MED ORDER — BISACODYL 10 MG RE SUPP: 10.0000 mg | Freq: Every day | RECTAL | Status: DC | PRN

## 2011-11-20 MED ORDER — NITROGLYCERIN 0.4 MG/SPRAY TL SOLN
1.0000 | Status: DC | PRN
  Filled 2011-11-20: qty 4.9

## 2011-11-20 MED ORDER — DONEPEZIL HCL 5 MG PO TABS
10.0000 mg | ORAL_TABLET | Freq: Every day | ORAL | Status: DC
  Filled 2011-11-20: qty 1

## 2011-11-20 MED ORDER — ATORVASTATIN CALCIUM 10 MG PO TABS
10.0000 mg | ORAL_TABLET | Freq: Every day | ORAL | Status: DC
  Administered 2011-11-20: 10 mg via ORAL
  Filled 2011-11-20 (×3): qty 1

## 2011-11-20 MED ORDER — CALCIUM CITRATE-VITAMIN D 250-200 MG-UNIT PO TABS: 1.0000 | ORAL_TABLET | Freq: Two times a day (BID) | ORAL | Status: DC

## 2011-11-20 MED ORDER — ONDANSETRON HCL 4 MG PO TABS: 4.0000 mg | ORAL_TABLET | Freq: Four times a day (QID) | ORAL | Status: DC | PRN

## 2011-11-20 MED ORDER — ONDANSETRON HCL 4 MG/2ML IJ SOLN: 4.0000 mg | Freq: Four times a day (QID) | INTRAMUSCULAR | Status: DC | PRN

## 2011-11-20 MED ORDER — ENOXAPARIN SODIUM 30 MG/0.3ML ~~LOC~~ SOLN
30.0000 mg | SUBCUTANEOUS | Status: DC
  Administered 2011-11-20: 30 mg via SUBCUTANEOUS
  Filled 2011-11-20: qty 0.3

## 2011-11-20 MED ORDER — ACETAMINOPHEN 650 MG RE SUPP: 650.0000 mg | Freq: Four times a day (QID) | RECTAL | Status: DC | PRN

## 2011-11-20 MED ORDER — ACETAMINOPHEN 325 MG PO TABS: 650.0000 mg | ORAL_TABLET | Freq: Four times a day (QID) | ORAL | Status: DC | PRN

## 2011-11-20 MED ORDER — LORAZEPAM 0.5 MG PO TABS: 0.5000 mg | ORAL_TABLET | Freq: Every day | ORAL | Status: DC

## 2011-11-20 MED ORDER — HYDROMORPHONE HCL PF 1 MG/ML IJ SOLN: 0.5000 mg | INTRAMUSCULAR | Status: DC | PRN

## 2011-11-20 MED ORDER — MIRTAZAPINE 30 MG PO TABS
15.0000 mg | ORAL_TABLET | Freq: Every day | ORAL | Status: DC
  Filled 2011-11-20: qty 1

## 2011-11-20 MED ORDER — HYDRALAZINE HCL 20 MG/ML IJ SOLN: 10.0000 mg | INTRAMUSCULAR | Status: DC | PRN

## 2011-11-20 MED ORDER — ASPIRIN EC 81 MG PO TBEC
81.0000 mg | DELAYED_RELEASE_TABLET | Freq: Every day | ORAL | Status: DC
  Administered 2011-11-20: 81 mg via ORAL
  Filled 2011-11-20 (×3): qty 1

## 2011-11-20 MED ORDER — POTASSIUM CHLORIDE IN NACL 20-0.9 MEQ/L-% IV SOLN
INTRAVENOUS | Status: DC
  Administered 2011-11-20 (×2): via INTRAVENOUS

## 2011-11-20 MED ORDER — FLEET ENEMA 7-19 GM/118ML RE ENEM: 1.0000 | ENEMA | Freq: Once | RECTAL | Status: DC | PRN

## 2011-11-20 MED ORDER — RISPERIDONE 0.5 MG PO TABS
0.5000 mg | ORAL_TABLET | Freq: Every day | ORAL | Status: DC
  Filled 2011-11-20: qty 1

## 2011-11-20 MED ORDER — OXYCODONE HCL 5 MG PO TABS: 5.0000 mg | ORAL_TABLET | ORAL | Status: DC | PRN

## 2011-11-20 NOTE — Discharge Summary (Signed)
Physician Discharge Summary  Patient ID: Joanna Mendez MRN: [id number] DOB/AGE: [id number] 76 y.o.  Admit date: 11/19/2011 Discharge date: 11/20/2011  Primary Care Physician:  Joanna Sprung, MD, MD   Discharge Diagnoses:    Active Problems:  Chest pain  Acute renal failure  HTN (hypertension)  Hyperlipidemia  Dementia Dehydration   Medication List  As of 11/20/2011  1:22 PM   STOP taking these medications         lisinopril 40 MG tablet         TAKE these medications         aspirin EC 81 MG tablet   Take 81 mg by mouth daily.      atorvastatin 10 MG tablet   Commonly known as: LIPITOR   Take 10 mg by mouth daily.      Calcium Citrate-Vitamin D 250-200 MG-UNIT Tabs   Take 1 tablet by mouth 2 (two) times daily.      donepezil 10 MG tablet   Commonly known as: ARICEPT   Take 10 mg by mouth at bedtime as needed.      LORazepam 1 MG tablet   Commonly known as: ATIVAN   Take 0.5 mg by mouth at bedtime.      Lycopene 10 MG Caps   Take 1 capsule by mouth daily.      mirtazapine 15 MG tablet   Commonly known as: REMERON   Take 15 mg by mouth at bedtime.      QC ARTHRITIS PAIN RELIEF 650 MG CR tablet   Generic drug: acetaminophen   Take 650 mg by mouth 3 (three) times daily as needed. For pain      risperiDONE 0.5 MG tablet   Commonly known as: RISPERDAL   Take 0.5 mg by mouth at bedtime.      UNABLE TO FIND   Take 1 Can by mouth 3 (three) times daily. Med Name: Sherryle Lis           Discharge Exam: Blood pressure 117/66, pulse 73, temperature 98.1 F (36.7 C), temperature source Oral, resp. rate 18, height 5\' 4"  (1.626 m), weight 53.1 kg (117 lb 1 oz), SpO2 95.00%. NAD CTA B S1, S2, RRR Soft, NT, BS+ No edema b/l  Disposition and Follow-up:  Discharge back to ALF Will need repeat BMET in 1 week  Consults: none   Significant Diagnostic Studies:  Dg Chest Portable 1 View  11/19/2011  *RADIOLOGY REPORT*  Clinical Data: Chest pain  PORTABLE CHEST -  1 VIEW  Comparison: None.  Findings: Heart size upper normal.  Aorta is uncoiled and atherosclerotic.  Negative for heart failure.  Lungs are clear without infiltrate or mass.  IMPRESSION: No active cardiopulmonary disease.  Original Report Authenticated By: Camelia Phenes, M.D.    Brief H and P: For complete details please refer to admission H and P, but in brief Joanna Mendez is an 78 y.o. female. Elderly demented Caucasian lady a resident of [facility] assisted [facility] facility, was brought to the emergency room with a history of chest pain lasting about 15 minutes. Reportedly complained of chest pain at the facility and also to EMS, but currently denies any chest pain or any history of chest pain. However because of the patient's dementia the history is not reliable.  Joanna Mendez that she chest pain was apparently left-sided and radiated through to her back; there is no record of fever dizziness,diaphoresis, cough, cold, nausea or vomiting.  She has no history of kidney  disease does have a history of hypertension and hyperlipidemia. Her medications include an ACE inhibitor but there is no record of her diuretic. She denies being thirsty.  A d-dimer was done because of her complaint of chest pain, and it is elevated and there is some concern for pulmonary embolus.   Hospital Course:  Active Problems:  Chest pain  Acute renal failure  HTN (hypertension)  Hyperlipidemia  Dementia  This lady was admitted to the hospital with complaints of chest pain. She was monitored on telemetry, cardiac markers were cycled and were found to be negative x3. She is no longer having any discomfort and wants to go home. Her EKG is unremarkable. We'll defer further workup of this chest pain to the outpatient setting but it does appear to be atypical.  She was also noted to have a normocytic anemia.  There is no evidence of bleeding. Further work up to be done as an outpatient.  Patient was found to have some renal  failure, it is unclear whether this is simply acute renal failure or if there is a chronic component to it. Patient was given IV fluids overnight and has had an improvement in her renal function. She is recommended to continue with adequate hydration by mouth. She'll need a repeat basic metabolic panel to be drawn in one week. She's not having any orthostasis, dizziness. It is felt appropriate to further manage this in the outpatient setting. Her ACE inhibitor has been discontinued. This can be restarted as felt appropriate when her renal function has improved. She'll be discharged back to her assisted-living center today.  Time spent on Discharge:  Signed: Rahsaan Mendez [facility] Pager: [phone] 11/20/2011, 1:22 PM

## 2011-11-20 NOTE — Progress Notes (Signed)
CSW faxed dc summary and FL2 to ALF. ALF reviewed and stated that patient could return. ALF stated they could not provide transportation. CSW spoke with patient's dtr-in-law who reported they would transport patient back to ALF. Dtr-in-law stated they would be at hospital around 1445 to pick up patient. CSW informed RN of this plan. CSW prepared dc packet. CSW is signing off. [place], [place] [phone] (Coverage)

## 2011-11-20 NOTE — Progress Notes (Signed)
Clinical Social Work Department BRIEF PSYCHOSOCIAL ASSESSMENT 11/20/2011  Patient:  Joanna Mendez, Joanna Mendez     Account Number:  [id number]     Admit date:  11/19/2011  Clinical Social Worker:  Dennison Bulla  Date/Time:  11/20/2011 11:10 AM  Referred by:  RN  Date Referred:  11/20/2011 Referred for  ALF Placement   Other Referral:   Interview type:  Patient Other interview type:   Also spoke with dtr-in-law Arline Asp) and son Gabriel Rung) [phone]    PSYCHOSOCIAL DATA Living Status:  FACILITY Admitted from facility:  [facility] Level of care:  Assisted Living Primary support name:  Joe Primary support relationship to patient:  CHILD, ADULT Degree of support available:   Adequate    CURRENT CONCERNS Current Concerns  Post-Acute Placement   Other Concerns:    SOCIAL WORK ASSESSMENT / PLAN CSW received referral due to patient being admitted from ALF. RN reported that patient was from [facility]. CSW met with patient at bedside. Patient reports she has lived at ALF for about two years and would like to return at dc. CSW called patient's son and dtr-in-law who reported that patient has been at ALF for 2 years and they wish for her to return. CSW explained CSW role and completed FL2. CSW called ALF who stated patient can return at dc.   Assessment/plan status:  Psychosocial Support/Ongoing Assessment of Needs Other assessment/ plan:   Information/referral to community resources:   Will return to ALF    PATIENT'S/FAMILY'S RESPONSE TO PLAN OF CARE: Patient was alert and sitting up in bed. Patient reported she wanted to dc. Patient and family appreciative of CSW consult. All parties are agreeable to patient returning to ALF at dc.

## 2011-11-20 NOTE — Progress Notes (Signed)
Family here to transport patient back to [facility].  Assisted patient with dressing.  Patient escorted to car by nurse tech.  Schonewitz, Candelaria Stagers 11/20/2011

## 2011-11-20 NOTE — Discharge Instructions (Signed)
Chest Pain (Nonspecific) It is often hard to give a specific diagnosis for the cause of chest pain. There is always a chance that your pain could be related to something serious, such as a heart attack or a blood clot in the lungs. You need to follow up with your caregiver for further evaluation. CAUSES   Heartburn.   Pneumonia or bronchitis.   Anxiety or stress.   Inflammation around your heart (pericarditis) or lung (pleuritis or pleurisy).   A blood clot in the lung.   A collapsed lung (pneumothorax). It can develop suddenly on its own (spontaneous pneumothorax) or from injury (trauma) to the chest.   Shingles infection (herpes zoster virus).  The chest wall is composed of bones, muscles, and cartilage. Any of these can be the source of the pain.  The bones can be bruised by injury.   The muscles or cartilage can be strained by coughing or overwork.   The cartilage can be affected by inflammation and become sore (costochondritis).  DIAGNOSIS  Lab tests or other studies, such as X-rays, electrocardiography, stress testing, or cardiac imaging, may be needed to find the cause of your pain.  TREATMENT   Treatment depends on what may be causing your chest pain. Treatment may include:   Acid blockers for heartburn.   Anti-inflammatory medicine.   Pain medicine for inflammatory conditions.   Antibiotics if an infection is present.   You may be advised to change lifestyle habits. This includes stopping smoking and avoiding alcohol, caffeine, and chocolate.   You may be advised to keep your head raised (elevated) when sleeping. This reduces the chance of acid going backward from your stomach into your esophagus.   Most of the time, nonspecific chest pain will improve within 2 to 3 days with rest and mild pain medicine.  HOME CARE INSTRUCTIONS   If antibiotics were prescribed, take your antibiotics as directed. Finish them even if you start to feel better.   For the next few  days, avoid physical activities that bring on chest pain. Continue physical activities as directed.   Do not smoke.   Avoid drinking alcohol.   Only take over-the-counter or prescription medicine for pain, discomfort, or fever as directed by your caregiver.   Follow your caregiver's suggestions for further testing if your chest pain does not go away.   Keep any follow-up appointments you made. If you do not go to an appointment, you could develop lasting (chronic) problems with pain. If there is any problem keeping an appointment, you must call to reschedule.  SEEK MEDICAL CARE IF:   You think you are having problems from the medicine you are taking. Read your medicine instructions carefully.   Your chest pain does not go away, even after treatment.   You develop a rash with blisters on your chest.  SEEK IMMEDIATE MEDICAL CARE IF:   You have increased chest pain or pain that spreads to your arm, neck, jaw, back, or abdomen.   You develop shortness of breath, an increasing cough, or you are coughing up blood.   You have severe back or abdominal pain, feel nauseous, or vomit.   You develop severe weakness, fainting, or chills.   You have a fever.  THIS IS AN EMERGENCY. Do not wait to see if the pain will go away. Get medical help at once. Call your local emergency services (911 in [place] Do not drive yourself to the hospital. MAKE SURE YOU:   Understand these instructions.     Will watch your condition.   Will get help right away if you are not doing well or get worse.  Document Released: 05/11/2005 Document Revised: 07/21/2011 Document Reviewed: 03/06/2008 [employer] Patient Information 2012 [employer]. 

## 2011-11-20 NOTE — Progress Notes (Signed)
Ambulated patient short distance in hallway, no respiratory distress or fatigue noted.  Oxygen saturation 94% with ambulation.  Schonewitz, Candelaria Stagers 11/20/2011

## 2011-11-22 NOTE — Progress Notes (Signed)
UR Chart Review Completed  

## 2011-11-27 ENCOUNTER — Emergency Department (HOSPITAL_COMMUNITY)
Admission: EM | Admit: 2011-11-27 | Discharge: 2011-11-27 | Disposition: A | Discharge status: Home / Self Care | Payer: Medicare Other | Attending: Emergency Medicine | Admitting: Emergency Medicine

## 2011-11-27 ENCOUNTER — Emergency Department (HOSPITAL_COMMUNITY): Payer: Medicare Other

## 2011-11-27 ENCOUNTER — Encounter (HOSPITAL_COMMUNITY): Payer: Self-pay | Admitting: *Deleted

## 2011-11-27 DIAGNOSIS — M810 Age-related osteoporosis without current pathological fracture: Secondary | ICD-10-CM | POA: Insufficient documentation

## 2011-11-27 DIAGNOSIS — I10 Essential (primary) hypertension: Secondary | ICD-10-CM | POA: Insufficient documentation

## 2011-11-27 DIAGNOSIS — R079 Chest pain, unspecified: Secondary | ICD-10-CM | POA: Insufficient documentation

## 2011-11-27 DIAGNOSIS — F0280 Dementia in other diseases classified elsewhere without behavioral disturbance: Secondary | ICD-10-CM | POA: Insufficient documentation

## 2011-11-27 DIAGNOSIS — M1990 Unspecified osteoarthritis, unspecified site: Secondary | ICD-10-CM | POA: Insufficient documentation

## 2011-11-27 DIAGNOSIS — Z7982 Long term (current) use of aspirin: Secondary | ICD-10-CM | POA: Insufficient documentation

## 2011-11-27 DIAGNOSIS — Z79899 Other long term (current) drug therapy: Secondary | ICD-10-CM | POA: Insufficient documentation

## 2011-11-27 DIAGNOSIS — E785 Hyperlipidemia, unspecified: Secondary | ICD-10-CM | POA: Insufficient documentation

## 2011-11-27 DIAGNOSIS — G309 Alzheimer's disease, unspecified: Secondary | ICD-10-CM | POA: Insufficient documentation

## 2011-11-27 LAB — DIFFERENTIAL
Basophils Absolute: 0 10*3/uL (ref 0.0–0.1)
Eosinophils Absolute: 0.1 10*3/uL (ref 0.0–0.7)
Eosinophils Relative: 2 % (ref 0–5)
Lymphocytes Relative: 33 % (ref 12–46)
Monocytes Absolute: 0.6 10*3/uL (ref 0.1–1.0)

## 2011-11-27 LAB — BASIC METABOLIC PANEL
CO2: 26 mEq/L (ref 19–32)
Calcium: 9.7 mg/dL (ref 8.4–10.5)
Glucose, Bld: 95 mg/dL (ref 70–99)
Potassium: 4.6 mEq/L (ref 3.5–5.1)
Sodium: 139 mEq/L (ref 135–145)

## 2011-11-27 LAB — CBC
Hemoglobin: 9.6 g/dL — ABNORMAL LOW (ref 12.0–15.0)
MCH: 31.2 pg (ref 26.0–34.0)
RBC: 3.08 MIL/uL — ABNORMAL LOW (ref 3.87–5.11)

## 2011-11-27 NOTE — ED Notes (Signed)
Resident at [facility] - EMS called out for cp that started at 1800 today.  Pt c/o upper epigastric pain.  Denies sob/n/v/d.  Pt alert and oriented x 2, self and place only which is pt's baseline.  EMS gave ASA 324mg  PO en route.

## 2011-11-27 NOTE — ED Provider Notes (Signed)
History     CSN: [id number]  Arrival date & time 11/27/11  1854   First MD Initiated Contact with Patient 11/27/11 1901      Chief Complaint  Patient presents with  . Chest Pain   Level V caveat for dementia  (Consider location/radiation/quality/duration/timing/severity/associated sxs/prior treatment) HPI  Patient presents to the emergency department via EMS from her nursing home called EMS stating the patient was complaining of chest pain at 6:00 this evening. When I enter the room patient states she feels fine. She denies chest pain, abdominal pain, nausea or vomiting. She states she feels fine.  PCP Dr. Reuel Boom  Past Medical History  Diagnosis Date  . Alzheimer disease   . Hypertension   . Anxiety   . Hyperlipidemia   . Osteoarthritis   . Allergic rhinitis   . Osteoporosis     Past Surgical History  Procedure Date  . Unable     No family history on file.  History  Substance Use Topics  . Smoking status: Never Smoker   . Smokeless tobacco: Never Used  . Alcohol Use: No  lives in NH  OB History    Grav Para Term Preterm Abortions TAB SAB Ect Mult Living                  Review of Systems  Unable to perform ROS: Dementia    Allergies  Review of patient's allergies indicates no known allergies.  Home Medications   Current Outpatient Rx  Name Route Sig Dispense Refill  . ACETAMINOPHEN ER 650 MG PO TBCR Oral Take 650 mg by mouth 3 (three) times daily.    . ASPIRIN EC 81 MG PO TBEC Oral Take 81 mg by mouth daily.    . ATORVASTATIN CALCIUM 10 MG PO TABS Oral Take 10 mg by mouth daily.    Marland Kitchen CALCIUM CITRATE-VITAMIN D 250-200 MG-UNIT PO TABS Oral Take 1 tablet by mouth 2 (two) times daily. 120 each   . CALCIUM CITRATE-VITAMIN D 250-200 MG-UNIT PO TABS Oral Take 2 tablets by mouth 3 (three) times daily.    . DONEPEZIL HCL 10 MG PO TABS Oral Take 10 mg by mouth at bedtime.    Marland Kitchen LISINOPRIL 40 MG PO TABS Oral Take 40 mg by mouth daily.    Marland Kitchen LORAZEPAM 1 MG PO  TABS Oral Take 0.5 mg by mouth at bedtime.    Marland Kitchen LYCOPENE 10 MG PO CAPS Oral Take 1 capsule by mouth daily.    Marland Kitchen MIRTAZAPINE 15 MG PO TABS Oral Take 15 mg by mouth at bedtime.    Marland Kitchen RISPERIDONE 0.5 MG PO TABS Oral Take 0.5 mg by mouth at bedtime.    Marland Kitchen UNABLE TO FIND Oral Take 1 Can by mouth 3 (three) times daily. Med Name: MIGHTY SHAKE      BP 101/83  Temp(Src) 98.6 F (37 C) (Oral)  Resp 16  Ht 5' (1.524 m)  SpO2 96%  Vital signs normal    Physical Exam  Constitutional: She appears well-developed and well-nourished. She is cooperative.  HENT:  Head: Normocephalic and atraumatic.  Right Ear: External ear normal.  Left Ear: External ear normal.  Nose: Nose normal.  Mouth/Throat: Oropharynx is clear and moist.  Eyes: Conjunctivae and EOM are normal. Pupils are equal, round, and reactive to light.  Neck: Normal range of motion and full passive range of motion without pain. Neck supple.  Cardiovascular: Normal rate, regular rhythm and normal heart sounds.   Pulmonary/Chest: Effort  normal and breath sounds normal. Not tachypneic. No respiratory distress.  Abdominal: Soft. Bowel sounds are normal. She exhibits no distension and no mass. There is no tenderness. There is no rebound and no guarding.  Musculoskeletal: Normal range of motion. She exhibits no tenderness.  Neurological: She is alert. She has normal strength and normal reflexes. No cranial nerve deficit.  Skin: Skin is warm, dry and intact.  Psychiatric: She has a normal mood and affect. Her speech is normal and behavior is normal. Thought content normal.    ED Course  Procedures (including critical care time)  Recheck 2100 pt states she feels fine, she doesn't c/o any pain.   Results for orders placed during the hospital encounter of 11/27/11  CBC      Component Value Range   WBC 6.4  4.0 - 10.5 (K/uL)   RBC 3.08 (*) 3.87 - 5.11 (MIL/uL)   Hemoglobin 9.6 (*) 12.0 - 15.0 (g/dL)   HCT 16.1 (*) 09.6 - 46.0 (%)   MCV  98.4  78.0 - 100.0 (fL)   MCH 31.2  26.0 - 34.0 (pg)   MCHC 31.7  30.0 - 36.0 (g/dL)   RDW 04.5 (*) 40.9 - 15.5 (%)   Platelets 396  150 - 400 (K/uL)  BASIC METABOLIC PANEL      Component Value Range   Sodium 139  135 - 145 (mEq/L)   Potassium 4.6  3.5 - 5.1 (mEq/L)   Chloride 104  96 - 112 (mEq/L)   CO2 26  19 - 32 (mEq/L)   Glucose, Bld 95  70 - 99 (mg/dL)   BUN 28 (*) 6 - 23 (mg/dL)   Creatinine, Ser 8.11 (*) 0.50 - 1.10 (mg/dL)   Calcium 9.7  8.4 - 91.4 (mg/dL)   GFR calc non Af Amer 32 (*) >90 (mL/min)   GFR calc Af Amer 37 (*) >90 (mL/min)  TROPONIN I      Component Value Range   Troponin I <0.30  <0.30 (ng/mL)  DIFFERENTIAL      Component Value Range   Neutrophils Relative 55  43 - 77 (%)   Neutro Abs 3.6  1.7 - 7.7 (K/uL)   Lymphocytes Relative 33  12 - 46 (%)   Lymphs Abs 2.2  0.7 - 4.0 (K/uL)   Monocytes Relative 9  3 - 12 (%)   Monocytes Absolute 0.6  0.1 - 1.0 (K/uL)   Eosinophils Relative 2  0 - 5 (%)   Eosinophils Absolute 0.1  0.0 - 0.7 (K/uL)   Basophils Relative 1  0 - 1 (%)   Basophils Absolute 0.0  0.0 - 0.1 (K/uL)  LIPASE, BLOOD      Component Value Range   Lipase 60 (*) 11 - 59 (U/L)  TROPONIN I      Component Value Range   Troponin I <0.30  <0.30 (ng/mL)   Laboratory interpretation all normal except stable anemia, renal insufficiency   Dg Chest Portable 1 View  11/19/2011  *RADIOLOGY REPORT*  Clinical Data: Chest pain  PORTABLE CHEST - 1 VIEW  Comparison: None.  Findings: Heart size upper normal.  Aorta is uncoiled and atherosclerotic.  Negative for heart failure.  Lungs are clear without infiltrate or mass.  IMPRESSION: No active cardiopulmonary disease.  Original Report Authenticated By: Camelia Phenes, M.D.     Date: 11/27/2011  Rate: 74  Rhythm: normal sinus rhythm  QRS Axis: normal  Intervals: normal  ST/T Wave abnormalities: normal  Conduction Disutrbances:none  Narrative  Interpretation: low voltage, Q waves inferiorly  Old EKG  Reviewed: none available    1. Chest pain    Plan discharge Devoria Albe, MD, FACEP    MDM           Ward Givens, MD 11/27/11 2150

## 2011-11-27 NOTE — ED Notes (Signed)
Report given to Jenny, RN

## 2011-11-27 NOTE — Discharge Instructions (Signed)
Joanna Mendez had no complaints of pain during her ED visit. Her tests do not show any evidence of a MI, she does have some mild elevations of her  lipase which appears to be chronic, please contact Dr Reuel Boom to see what further treatment or testing he would like to persue.

## 2013-01-31 DIAGNOSIS — I639 Cerebral infarction, unspecified: Secondary | ICD-10-CM

## 2013-01-31 HISTORY — DX: Cerebral infarction, unspecified: I63.9

## 2013-05-12 ENCOUNTER — Encounter (HOSPITAL_COMMUNITY): Payer: Self-pay | Admitting: Emergency Medicine

## 2013-05-12 ENCOUNTER — Emergency Department (HOSPITAL_COMMUNITY)
Admission: EM | Admit: 2013-05-12 | Discharge: 2013-05-12 | Disposition: A | Discharge status: Home / Self Care | Payer: Medicare Other | Attending: Emergency Medicine | Admitting: Emergency Medicine

## 2013-05-12 ENCOUNTER — Emergency Department (HOSPITAL_COMMUNITY): Payer: Medicare Other

## 2013-05-12 DIAGNOSIS — M1990 Unspecified osteoarthritis, unspecified site: Secondary | ICD-10-CM | POA: Insufficient documentation

## 2013-05-12 DIAGNOSIS — Z8709 Personal history of other diseases of the respiratory system: Secondary | ICD-10-CM | POA: Insufficient documentation

## 2013-05-12 DIAGNOSIS — Z7982 Long term (current) use of aspirin: Secondary | ICD-10-CM | POA: Insufficient documentation

## 2013-05-12 DIAGNOSIS — G309 Alzheimer's disease, unspecified: Secondary | ICD-10-CM | POA: Insufficient documentation

## 2013-05-12 DIAGNOSIS — S0180XA Unspecified open wound of other part of head, initial encounter: Secondary | ICD-10-CM | POA: Insufficient documentation

## 2013-05-12 DIAGNOSIS — S0990XA Unspecified injury of head, initial encounter: Secondary | ICD-10-CM | POA: Insufficient documentation

## 2013-05-12 DIAGNOSIS — I10 Essential (primary) hypertension: Secondary | ICD-10-CM | POA: Insufficient documentation

## 2013-05-12 DIAGNOSIS — S0181XA Laceration without foreign body of other part of head, initial encounter: Secondary | ICD-10-CM

## 2013-05-12 DIAGNOSIS — F0280 Dementia in other diseases classified elsewhere without behavioral disturbance: Secondary | ICD-10-CM | POA: Insufficient documentation

## 2013-05-12 DIAGNOSIS — Z79899 Other long term (current) drug therapy: Secondary | ICD-10-CM | POA: Insufficient documentation

## 2013-05-12 DIAGNOSIS — Y92009 Unspecified place in unspecified non-institutional (private) residence as the place of occurrence of the external cause: Secondary | ICD-10-CM | POA: Insufficient documentation

## 2013-05-12 DIAGNOSIS — E785 Hyperlipidemia, unspecified: Secondary | ICD-10-CM | POA: Insufficient documentation

## 2013-05-12 DIAGNOSIS — R011 Cardiac murmur, unspecified: Secondary | ICD-10-CM | POA: Insufficient documentation

## 2013-05-12 DIAGNOSIS — W1809XA Striking against other object with subsequent fall, initial encounter: Secondary | ICD-10-CM | POA: Insufficient documentation

## 2013-05-12 DIAGNOSIS — S0003XA Contusion of scalp, initial encounter: Secondary | ICD-10-CM | POA: Insufficient documentation

## 2013-05-12 DIAGNOSIS — Y9301 Activity, walking, marching and hiking: Secondary | ICD-10-CM | POA: Insufficient documentation

## 2013-05-12 DIAGNOSIS — S0083XA Contusion of other part of head, initial encounter: Secondary | ICD-10-CM

## 2013-05-12 DIAGNOSIS — F411 Generalized anxiety disorder: Secondary | ICD-10-CM | POA: Insufficient documentation

## 2013-05-12 DIAGNOSIS — M810 Age-related osteoporosis without current pathological fracture: Secondary | ICD-10-CM | POA: Insufficient documentation

## 2013-05-12 MED ORDER — BACITRACIN ZINC 500 UNIT/GM EX OINT
TOPICAL_OINTMENT | CUTANEOUS | Status: AC
  Filled 2013-05-12: qty 0.9

## 2013-05-12 MED ORDER — LIDOCAINE HCL (PF) 1 % IJ SOLN
INTRAMUSCULAR | Status: AC
  Filled 2013-05-12: qty 5

## 2013-05-12 MED ORDER — NAPROXEN 500 MG PO TABS: 500.0000 mg | ORAL_TABLET | Freq: Two times a day (BID) | ORAL | Status: DC

## 2013-05-12 NOTE — ED Notes (Signed)
Pt is from [facility]. EMS reports that pt was trying to get out of bed and hit her head on the windowsill. EMS reports that laceration is 1.5-2 inches. Bleeding controlled and dressing applied.

## 2013-05-12 NOTE — ED Provider Notes (Signed)
CSN: [id number]     Arrival date & time 05/12/13  0357 History   None    Chief Complaint  Patient presents with  . Head Laceration  . Fall   (Consider location/radiation/quality/duration/timing/severity/associated sxs/prior Treatment) HPI Comments: 79 year old female with a history of Alzheimer's disease which is mild, hypertension and he takes a baby aspirin every day. She presents after stating that she got up out of her bed, she was walking in her room and fell into the windowsill causing a head injury. This was acute in onset, she states that she very clearly remembers being on the ground, not losing consciousness, not having a seizure, not being nauseated and not having a change in her vision. Because of her fall with a head injury she was transferred to the hospital for evaluation. Paramedics noted that she had a laceration to her forehead as well as a hematoma to the right side of the forehead.  This fall occurred just prior to arrival, the symptoms are persistent, mild  Patient is a 79 y.o. female presenting with scalp laceration and fall. The history is provided by the patient and the EMS personnel.  Head Laceration  Fall    Past Medical History  Diagnosis Date  . Alzheimer disease   . Hypertension   . Anxiety   . Hyperlipidemia   . Osteoarthritis   . Allergic rhinitis   . Osteoporosis    Past Surgical History  Procedure Laterality Date  . Unable     No family history on file. History  Substance Use Topics  . Smoking status: Never Smoker   . Smokeless tobacco: Never Used  . Alcohol Use: No   OB History   Grav Para Term Preterm Abortions TAB SAB Ect Mult Living                 Review of Systems  All other systems reviewed and are negative.    Allergies  Review of patient's allergies indicates no known allergies.  Home Medications   Current Outpatient Rx  Name  Route  Sig  Dispense  Refill  . acetaminophen (TYLENOL) 650 MG CR tablet   Oral   Take 500  mg by mouth 3 (three) times daily.          Marland Kitchen albuterol (PROVENTIL HFA;VENTOLIN HFA) 108 (90 BASE) MCG/ACT inhaler   Inhalation   Inhale 2 puffs into the lungs every 4 (four) hours as needed for wheezing.         Marland Kitchen atorvastatin (LIPITOR) 10 MG tablet   Oral   Take 10 mg by mouth daily.         . Calcium Citrate-Vitamin D 250-200 MG-UNIT TABS   Oral   Take 2 tablets by mouth 3 (three) times daily.         . Cranberry 475 MG CAPS   Oral   Take 475 mg by mouth 2 (two) times daily.         Marland Kitchen donepezil (ARICEPT) 10 MG tablet   Oral   Take 10 mg by mouth at bedtime.         . ferrous sulfate 325 (65 FE) MG tablet   Oral   Take 325 mg by mouth daily with breakfast.         . Lycopene 10 MG CAPS   Oral   Take 1 capsule by mouth daily.         . Melatonin 5 MG CAPS   Oral   Take 5  mg by mouth at bedtime.         . mirtazapine (REMERON) 15 MG tablet   Oral   Take 7.5 mg by mouth at bedtime.          . traMADol (ULTRAM) 50 MG tablet   Oral   Take 50 mg by mouth every 8 (eight) hours as needed. pain         . UNABLE TO FIND   Oral   Take 1 Can by mouth 3 (three) times daily. Med Name: MIGHTY SHAKE         . aspirin EC 81 MG tablet   Oral   Take 81 mg by mouth daily.         Marland Kitchen LORazepam (ATIVAN) 1 MG tablet   Oral   Take 0.5 mg by mouth at bedtime.         . naproxen (NAPROSYN) 500 MG tablet   Oral   Take 1 tablet (500 mg total) by mouth 2 (two) times daily with a meal.   30 tablet   0   . risperiDONE (RISPERDAL) 0.5 MG tablet   Oral   Take 0.5 mg by mouth at bedtime.          BP 188/81  Pulse 66  Temp(Src) 98 F (36.7 C) (Oral)  Resp 20  Ht 5\' 3"  (1.6 m)  Wt 117 lb (53.071 kg)  BMI 20.73 kg/m2  SpO2 99% Physical Exam  Nursing note and vitals reviewed. Constitutional: She appears well-developed and well-nourished. No distress.  HENT:  Head: Normocephalic.  Mouth/Throat: Oropharynx is clear and moist. No oropharyngeal  exudate.  No malocclusion, no hemotympanum, no raccoon eyes, no battle sign. There is a large hematoma to the right side of the forehead and a laceration to the mid forehead. There are 2 small abrasions to the right side of the hematoma  Eyes: Conjunctivae and EOM are normal. Pupils are equal, round, and reactive to light. Right eye exhibits no discharge. Left eye exhibits no discharge. No scleral icterus.  Neck: Normal range of motion. Neck supple. No JVD present. No thyromegaly present.  Cardiovascular: Normal rate, regular rhythm and intact distal pulses.  Exam reveals no gallop and no friction rub.   Murmur (systolic) heard. Pulmonary/Chest: Effort normal and breath sounds normal. No respiratory distress. She has no wheezes. She has no rales.  Abdominal: Soft. Bowel sounds are normal. She exhibits no distension and no mass. There is no tenderness.  Musculoskeletal: Normal range of motion. She exhibits no edema and no tenderness.  No deformities or injuries to her 4 extremities. No spinal tenderness  Lymphadenopathy:    She has no cervical adenopathy.  Neurological: She is alert. Coordination normal.  The patient is alert, oriented to person place and time, she knows where she lives and remembers the fall. She has no ataxia, clear speech, normal grips bilaterally. She follows commands without difficulty  Skin: Skin is warm and dry. No rash noted. No erythema.  Psychiatric: She has a normal mood and affect. Her behavior is normal.    ED Course  Procedures (including critical care time) Labs Review Labs Reviewed - No data to display Imaging Review Ct Head Wo Contrast  05/12/2013   *RADIOLOGY REPORT*  Clinical Data:  Trauma, head laceration, neck pain  CT HEAD WITHOUT CONTRAST CT CERVICAL SPINE WITHOUT CONTRAST  Technique:  Multidetector CT imaging of the head and cervical spine was performed following the standard protocol without intravenous contrast.  Multiplanar CT  image reconstructions  of the cervical spine were also generated.  Comparison:  Prior CT from 02/12/2013and radiograph from 10/27/2007  CT HEAD  Findings: A large soft tissue contusion is present at the right lateral forehead.  The underlying calvarium is intact.  No skull fracture identified.  The right globe is intact.  Moderate age related atrophy with chronic microvascular ischemic disease is similar as compared to the prior examination.  No acute intracranial hemorrhage or infarct identified.  There is no extra- axial fluid collection.  Gray-white matter differentiation is maintained.  No mastoid effusion.  Paranasal sinuses are clear.  IMPRESSION:  1. Large right forehead scalp contusion.  No skull fracture or acute intracranial process identified.  Intact right globe. 2. Moderate atrophy with chronic microvascular ischemic disease, similar as compared to prior CT from 09/27/2011.  CT CERVICAL SPINE  Findings: There is reversal of the normal cervical lordosis with apex at the C5 level, similar as compared to prior radiograph from 10/27/2007. Grade 1 anterolisthesis of C3 on C4 and C4 on C5 is also similar.  There is trace retrolisthesis of C6 on C7, with trace anterolisthesis of C7 on T1.  There is essentially complete ankylosis of the C5 and C6 vertebral bodies. Severe degenerative intervertebral disc space narrowing is also present at C4-5 and C6- 7.  No acute fracture or listhesis is identified.  Normal C1-2 articulations are maintained.  The dens is intact.  No prevertebral soft tissue swelling.  Facet joints are normally aligned.  The visualized lung apices are clear.  No soft tissue abnormality identified within the neck. Prominent vascular calcifications are noted within the carotid bifurcations bilaterally.  Diffuse osteopenia is present.  IMPRESSION:  1.  No CT evidence of acute fracture or listhesis within the cervical spine. 2.  Multilevel degenerative disc disease as detailed above with degenerative ankylosis of the C5  and C6 vertebral bodies.   Original Report Authenticated By: Rise Mu, M.D.   Ct Cervical Spine Wo Contrast  05/12/2013   *RADIOLOGY REPORT*  Clinical Data:  Trauma, head laceration, neck pain  CT HEAD WITHOUT CONTRAST CT CERVICAL SPINE WITHOUT CONTRAST  Technique:  Multidetector CT imaging of the head and cervical spine was performed following the standard protocol without intravenous contrast.  Multiplanar CT image reconstructions of the cervical spine were also generated.  Comparison:  Prior CT from 02/12/2013and radiograph from 10/27/2007  CT HEAD  Findings: A large soft tissue contusion is present at the right lateral forehead.  The underlying calvarium is intact.  No skull fracture identified.  The right globe is intact.  Moderate age related atrophy with chronic microvascular ischemic disease is similar as compared to the prior examination.  No acute intracranial hemorrhage or infarct identified.  There is no extra- axial fluid collection.  Gray-white matter differentiation is maintained.  No mastoid effusion.  Paranasal sinuses are clear.  IMPRESSION:  1. Large right forehead scalp contusion.  No skull fracture or acute intracranial process identified.  Intact right globe. 2. Moderate atrophy with chronic microvascular ischemic disease, similar as compared to prior CT from 09/27/2011.  CT CERVICAL SPINE  Findings: There is reversal of the normal cervical lordosis with apex at the C5 level, similar as compared to prior radiograph from 10/27/2007. Grade 1 anterolisthesis of C3 on C4 and C4 on C5 is also similar.  There is trace retrolisthesis of C6 on C7, with trace anterolisthesis of C7 on T1.  There is essentially complete ankylosis of the C5 and C6 vertebral  bodies. Severe degenerative intervertebral disc space narrowing is also present at C4-5 and C6- 7.  No acute fracture or listhesis is identified.  Normal C1-2 articulations are maintained.  The dens is intact.  No prevertebral soft tissue  swelling.  Facet joints are normally aligned.  The visualized lung apices are clear.  No soft tissue abnormality identified within the neck. Prominent vascular calcifications are noted within the carotid bifurcations bilaterally.  Diffuse osteopenia is present.  IMPRESSION:  1.  No CT evidence of acute fracture or listhesis within the cervical spine. 2.  Multilevel degenerative disc disease as detailed above with degenerative ankylosis of the C5 and C6 vertebral bodies.   Original Report Authenticated By: Rise Mu, M.D.    MDM   1. Head injury, acute, initial encounter   2. Laceration of forehead, initial encounter   3. Traumatic hematoma of forehead, initial encounter    The patient has a large hematoma and a laceration. She will need imaging to rule out intracranial injury as well as spinal fracture, laceration has been repaired without difficulty, the patient appears somewhat comfortable at this time.  LACERATION REPAIR Performed by: Vida Roller Authorized by: Vida Roller Consent: Verbal consent obtained. Risks and benefits: risks, benefits and alternatives were discussed Consent given by: patient Patient identity confirmed: provided demographic data Prepped and Draped in normal sterile fashion Wound explored  Laceration Location: For head  Laceration Length: 3 cm  No Foreign Bodies seen or palpated  Anesthesia: local infiltration  Local anesthetic: lidocaine 1 % without epinephrine  Anesthetic total: 2 ml  Irrigation method: syringe Amount of cleaning: standard  Skin closure: 6-0 Prolene   Number of sutures: 3   Technique: Simple interrupted   Patient tolerance: Patient tolerated the procedure well with no immediate complications.   CT scan imaging shows no signs of intracranial hemorrhage, no signs of spinal fracture, patient stable for discharge.   Meds given in ED:  Medications  lidocaine (PF) (XYLOCAINE) 1 % injection (not administered)   bacitracin 500 UNIT/GM ointment (not administered)    New Prescriptions   NAPROXEN (NAPROSYN) 500 MG TABLET    Take 1 tablet (500 mg total) by mouth 2 (two) times daily with a meal.      Vida Roller, MD 05/12/13 [phone]

## 2013-07-10 ENCOUNTER — Encounter (HOSPITAL_COMMUNITY): Payer: Self-pay | Admitting: Emergency Medicine

## 2013-07-10 ENCOUNTER — Emergency Department (HOSPITAL_COMMUNITY)
Admission: EM | Admit: 2013-07-10 | Discharge: 2013-07-10 | Disposition: A | Discharge status: Home / Self Care | Payer: Medicare Other | Attending: Emergency Medicine | Admitting: Emergency Medicine

## 2013-07-10 DIAGNOSIS — G309 Alzheimer's disease, unspecified: Secondary | ICD-10-CM | POA: Insufficient documentation

## 2013-07-10 DIAGNOSIS — M810 Age-related osteoporosis without current pathological fracture: Secondary | ICD-10-CM | POA: Insufficient documentation

## 2013-07-10 DIAGNOSIS — E785 Hyperlipidemia, unspecified: Secondary | ICD-10-CM | POA: Insufficient documentation

## 2013-07-10 DIAGNOSIS — Z8709 Personal history of other diseases of the respiratory system: Secondary | ICD-10-CM | POA: Insufficient documentation

## 2013-07-10 DIAGNOSIS — I10 Essential (primary) hypertension: Secondary | ICD-10-CM | POA: Insufficient documentation

## 2013-07-10 DIAGNOSIS — Z7982 Long term (current) use of aspirin: Secondary | ICD-10-CM | POA: Insufficient documentation

## 2013-07-10 DIAGNOSIS — Z791 Long term (current) use of non-steroidal anti-inflammatories (NSAID): Secondary | ICD-10-CM | POA: Insufficient documentation

## 2013-07-10 DIAGNOSIS — Z711 Person with feared health complaint in whom no diagnosis is made: Secondary | ICD-10-CM

## 2013-07-10 DIAGNOSIS — F0280 Dementia in other diseases classified elsewhere without behavioral disturbance: Secondary | ICD-10-CM | POA: Insufficient documentation

## 2013-07-10 DIAGNOSIS — F411 Generalized anxiety disorder: Secondary | ICD-10-CM | POA: Insufficient documentation

## 2013-07-10 DIAGNOSIS — M1990 Unspecified osteoarthritis, unspecified site: Secondary | ICD-10-CM | POA: Insufficient documentation

## 2013-07-10 LAB — URINE MICROSCOPIC-ADD ON

## 2013-07-10 LAB — URINALYSIS, ROUTINE W REFLEX MICROSCOPIC
Bilirubin Urine: NEGATIVE
Glucose, UA: NEGATIVE mg/dL
Protein, ur: NEGATIVE mg/dL

## 2013-07-10 NOTE — ED Notes (Addendum)
Staff at [facility] reports blood in stool last night. Pt denies. edp at bedside. Hemoccult negative.

## 2013-07-10 NOTE — ED Provider Notes (Signed)
CSN: [id number]     Arrival date & time 07/10/13  1123 History  This chart was scribed for Gilda Crease, MD by Caryn Bee, ED Scribe. This patient was seen in room APA19/APA19 and the patient's care was started 11:20 AM.    Chief Complaint  Patient presents with  . Rectal Bleeding   HPI HPI Comments: Veneda P Hackworth is a 79 y.o. female brought in by ambulance, who presents to the Emergency Department from [facility] for suspicion of rectal bleeding last night. Pt states that she feels fine. She denies hematochezia, chest pain, SOB, or dizziness. Pt's PCP is Dr. Donzetta Sprung.    Past Medical History  Diagnosis Date  . Alzheimer disease   . Hypertension   . Anxiety   . Hyperlipidemia   . Osteoarthritis   . Allergic rhinitis   . Osteoporosis    Past Surgical History  Procedure Laterality Date  . Unable     History reviewed. No pertinent family history. History  Substance Use Topics  . Smoking status: Never Smoker   . Smokeless tobacco: Never Used  . Alcohol Use: No   OB History   Grav Para Term Preterm Abortions TAB SAB Ect Mult Living                 Review of Systems  Respiratory: Negative for shortness of breath.   Cardiovascular: Negative for chest pain.  Gastrointestinal: Negative for blood in stool.  Neurological: Negative for dizziness.  All other systems reviewed and are negative.    Allergies  Review of patient's allergies indicates no known allergies.  Home Medications   Current Outpatient Rx  Name  Route  Sig  Dispense  Refill  . acetaminophen (TYLENOL) 650 MG CR tablet   Oral   Take 500 mg by mouth 3 (three) times daily.          Marland Kitchen albuterol (PROVENTIL HFA;VENTOLIN HFA) 108 (90 BASE) MCG/ACT inhaler   Inhalation   Inhale 2 puffs into the lungs every 4 (four) hours as needed for wheezing.         Marland Kitchen aspirin EC 81 MG tablet   Oral   Take 81 mg by mouth daily.         Marland Kitchen atorvastatin (LIPITOR) 10 MG tablet   Oral   Take 10  mg by mouth daily.         . Calcium Citrate-Vitamin D 250-200 MG-UNIT TABS   Oral   Take 2 tablets by mouth 3 (three) times daily.         . Cranberry 475 MG CAPS   Oral   Take 475 mg by mouth 2 (two) times daily.         Marland Kitchen donepezil (ARICEPT) 10 MG tablet   Oral   Take 10 mg by mouth at bedtime.         . ferrous sulfate 325 (65 FE) MG tablet   Oral   Take 325 mg by mouth daily with breakfast.         . LORazepam (ATIVAN) 1 MG tablet   Oral   Take 0.5 mg by mouth at bedtime.         . Lycopene 10 MG CAPS   Oral   Take 1 capsule by mouth daily.         . Melatonin 5 MG CAPS   Oral   Take 5 mg by mouth at bedtime.         Marland Kitchen  mirtazapine (REMERON) 15 MG tablet   Oral   Take 7.5 mg by mouth at bedtime.          . naproxen (NAPROSYN) 500 MG tablet   Oral   Take 1 tablet (500 mg total) by mouth 2 (two) times daily with a meal.   30 tablet   0   . risperiDONE (RISPERDAL) 0.5 MG tablet   Oral   Take 0.5 mg by mouth at bedtime.         . traMADol (ULTRAM) 50 MG tablet   Oral   Take 50 mg by mouth every 8 (eight) hours as needed. pain         . UNABLE TO FIND   Oral   Take 1 Can by mouth 3 (three) times daily. Med Name: MIGHTY SHAKE          Pulse 57  Resp 20  Ht 5\' 2"  (1.575 m)  Wt 115 lb (52.164 kg)  BMI 21.03 kg/m2  SpO2 95%  Physical Exam  Nursing note and vitals reviewed. Constitutional: She is oriented to person, place, and time. She appears well-developed and well-nourished. No distress.  HENT:  Head: Normocephalic and atraumatic.  Right Ear: Hearing normal.  Left Ear: Hearing normal.  Nose: Nose normal.  Mouth/Throat: Oropharynx is clear and moist and mucous membranes are normal.  Eyes: Conjunctivae and EOM are normal. Pupils are equal, round, and reactive to light.  Neck: Normal range of motion. Neck supple.  Cardiovascular: Normal rate, regular rhythm, S1 normal, S2 normal and normal heart sounds.  Exam reveals no gallop  and no friction rub.   No murmur heard. Pulmonary/Chest: Effort normal and breath sounds normal. No respiratory distress. She has no wheezes. She has no rales. She exhibits no tenderness.  Abdominal: Soft. Normal appearance and bowel sounds are normal. There is no hepatosplenomegaly. There is no tenderness. There is no rebound, no guarding, no tenderness at McBurney's point and negative Murphy's sign. No hernia.  Musculoskeletal: Normal range of motion.  Neurological: She is alert and oriented to person, place, and time. She has normal strength. No cranial nerve deficit or sensory deficit. Coordination normal. GCS eye subscore is 4. GCS verbal subscore is 5. GCS motor subscore is 6.  Skin: Skin is warm, dry and intact. No rash noted. No cyanosis.  Psychiatric: She has a normal mood and affect. Her speech is normal and behavior is normal. Thought content normal.    ED Course  Procedures (including critical care time) DIAGNOSTIC STUDIES: Oxygen Saturation is 95% on room air, adequate by my interpretation.    COORDINATION OF CARE: 11:24 AM-Discussed treatment plan with pt at bedside and pt agreed to plan.   Labs Review Labs Reviewed  URINALYSIS, ROUTINE W REFLEX MICROSCOPIC - Abnormal; Notable for the following:    Hgb urine dipstick TRACE (*)    All other components within normal limits  URINE MICROSCOPIC-ADD ON   Imaging Review No results found.  EKG Interpretation   None       MDM  Diagnosis: Worried well  Patient sent to the ER from nursing home because they noticed some blood in her diaper. Rectal exam was heme-negative. Catheterized urine specimen did not show any evidence of hematuria. Visual inspection did not show any vaginal bleeding. Unclear if there was actually blood seen at the nursing home because there is no sign of bleeding currently. Patient returned to the nursing home.  I personally performed the services described in this documentation, which  was scribed in my  presence. The recorded information has been reviewed and is accurate.     Gilda Crease, MD 07/11/13 [phone]

## 2013-09-02 ENCOUNTER — Encounter (HOSPITAL_COMMUNITY): Payer: Self-pay | Admitting: Emergency Medicine

## 2013-09-02 ENCOUNTER — Emergency Department (HOSPITAL_COMMUNITY)
Admission: EM | Admit: 2013-09-02 | Discharge: 2013-09-02 | Disposition: A | Discharge status: Home / Self Care | Payer: Medicare Other | Attending: Emergency Medicine | Admitting: Emergency Medicine

## 2013-09-02 DIAGNOSIS — Y929 Unspecified place or not applicable: Secondary | ICD-10-CM | POA: Insufficient documentation

## 2013-09-02 DIAGNOSIS — Z8709 Personal history of other diseases of the respiratory system: Secondary | ICD-10-CM | POA: Insufficient documentation

## 2013-09-02 DIAGNOSIS — F411 Generalized anxiety disorder: Secondary | ICD-10-CM | POA: Insufficient documentation

## 2013-09-02 DIAGNOSIS — M1990 Unspecified osteoarthritis, unspecified site: Secondary | ICD-10-CM | POA: Insufficient documentation

## 2013-09-02 DIAGNOSIS — I10 Essential (primary) hypertension: Secondary | ICD-10-CM | POA: Insufficient documentation

## 2013-09-02 DIAGNOSIS — F0280 Dementia in other diseases classified elsewhere without behavioral disturbance: Secondary | ICD-10-CM | POA: Insufficient documentation

## 2013-09-02 DIAGNOSIS — W1809XA Striking against other object with subsequent fall, initial encounter: Secondary | ICD-10-CM | POA: Insufficient documentation

## 2013-09-02 DIAGNOSIS — E785 Hyperlipidemia, unspecified: Secondary | ICD-10-CM | POA: Insufficient documentation

## 2013-09-02 DIAGNOSIS — W19XXXA Unspecified fall, initial encounter: Secondary | ICD-10-CM

## 2013-09-02 DIAGNOSIS — S0990XA Unspecified injury of head, initial encounter: Secondary | ICD-10-CM | POA: Insufficient documentation

## 2013-09-02 DIAGNOSIS — G309 Alzheimer's disease, unspecified: Secondary | ICD-10-CM | POA: Insufficient documentation

## 2013-09-02 DIAGNOSIS — Y9301 Activity, walking, marching and hiking: Secondary | ICD-10-CM | POA: Insufficient documentation

## 2013-09-02 DIAGNOSIS — Z791 Long term (current) use of non-steroidal anti-inflammatories (NSAID): Secondary | ICD-10-CM | POA: Insufficient documentation

## 2013-09-02 DIAGNOSIS — Z79899 Other long term (current) drug therapy: Secondary | ICD-10-CM | POA: Insufficient documentation

## 2013-09-02 DIAGNOSIS — Z7982 Long term (current) use of aspirin: Secondary | ICD-10-CM | POA: Insufficient documentation

## 2013-09-02 NOTE — Discharge Instructions (Signed)
Patient is alert. No bony tenderness. No x-rays necessary.

## 2013-09-02 NOTE — ED Notes (Signed)
Pt comes from [place] after a fall today. Pt states she was walking and fell forward to the ground. Pt states she hit her head on the ground but denies LOC. Pt denies any pain. Pt is A&O x4.

## 2013-09-02 NOTE — ED Notes (Signed)
Resident at [facility] - per EMS - pt was walking with a  Walker, when fell backward hitting head on chair.  Denies LOC.  Pt denies pain, alert.  c-collar and lsb upon arrival.

## 2013-09-02 NOTE — ED Provider Notes (Signed)
CSN: [id number]     Arrival date & time 09/02/13  1253 History  This chart was scribed for Nat Christen, MD by Eugenia Mcalpine, ED Scribe. This patient was seen in room APA15/APA15 and the patient's care was started at 1:12 PM.    Chief Complaint  Patient presents with  . Fall   (Consider location/radiation/quality/duration/timing/severity/associated sxs/prior Treatment) Patient is a 80 y.o. female presenting with fall. The history is provided by the patient. No language interpreter was used.  Fall   HPI Comments: Level V caveat secondary to dementia Lucila P Fouts is a 80 y.o. female who presents to the Emergency Department Per EMS.    She was walking with her walker and fell backward hitting her head.   No changes in her behavior. No complaints of bony tenderness. No neck pain. She appears to be her normal self.  Past Medical History  Diagnosis Date  . Alzheimer disease   . Hypertension   . Anxiety   . Hyperlipidemia   . Osteoarthritis   . Allergic rhinitis   . Osteoporosis    Past Surgical History  Procedure Laterality Date  . Unable     No family history on file. History  Substance Use Topics  . Smoking status: Never Smoker   . Smokeless tobacco: Never Used  . Alcohol Use: No   OB History   Grav Para Term Preterm Abortions TAB SAB Ect Mult Living                 Review of Systems  Unable to perform ROS: Mental status change  All other systems reviewed and are negative.    Allergies  Review of patient's allergies indicates no known allergies.  Home Medications   Current Outpatient Rx  Name  Route  Sig  Dispense  Refill  . acetaminophen (TYLENOL) 650 MG CR tablet   Oral   Take 500 mg by mouth 3 (three) times daily.          Marland Kitchen albuterol (PROVENTIL HFA;VENTOLIN HFA) 108 (90 BASE) MCG/ACT inhaler   Inhalation   Inhale 2 puffs into the lungs every 4 (four) hours as needed for wheezing.         Marland Kitchen aspirin EC 81 MG tablet   Oral   Take 81 mg by mouth daily.          Marland Kitchen atorvastatin (LIPITOR) 10 MG tablet   Oral   Take 10 mg by mouth daily.         . Calcium Citrate-Vitamin D 250-200 MG-UNIT TABS   Oral   Take 2 tablets by mouth 3 (three) times daily.         . Cranberry 475 MG CAPS   Oral   Take 475 mg by mouth 2 (two) times daily.         Marland Kitchen donepezil (ARICEPT) 10 MG tablet   Oral   Take 10 mg by mouth at bedtime.         . ferrous sulfate 325 (65 FE) MG tablet   Oral   Take 325 mg by mouth daily with breakfast.         . LORazepam (ATIVAN) 1 MG tablet   Oral   Take 0.5 mg by mouth at bedtime.         . Lycopene 10 MG CAPS   Oral   Take 1 capsule by mouth daily.         . Melatonin 5 MG CAPS   Oral  Take 5 mg by mouth at bedtime.         . mirtazapine (REMERON) 15 MG tablet   Oral   Take 7.5 mg by mouth at bedtime.          . naproxen (NAPROSYN) 500 MG tablet   Oral   Take 1 tablet (500 mg total) by mouth 2 (two) times daily with a meal.   30 tablet   0   . risperiDONE (RISPERDAL) 0.5 MG tablet   Oral   Take 0.5 mg by mouth at bedtime.         . traMADol (ULTRAM) 50 MG tablet   Oral   Take 50 mg by mouth every 8 (eight) hours as needed. pain         . UNABLE TO FIND   Oral   Take 1 Can by mouth 3 (three) times daily. Med Name: MIGHTY SHAKE          BP 130/70  Pulse 70  Temp(Src) 97.9 F (36.6 C) (Oral)  Resp 22  SpO2 90% Physical Exam  Nursing note and vitals reviewed. Constitutional:  Demented  HENT:  Head: Normocephalic and atraumatic.  Eyes: Conjunctivae and EOM are normal. Pupils are equal, round, and reactive to light.  Neck: Normal range of motion. Neck supple.  Cardiovascular: Normal rate, regular rhythm and normal heart sounds.   Pulmonary/Chest: Effort normal and breath sounds normal.  Abdominal: Soft. Bowel sounds are normal.  Musculoskeletal: Normal range of motion.  Neurological:  No neurological impairment  Skin: Skin is warm and dry.  Psychiatric:  Flat  affect    ED Course  Procedures (including critical care time)  DIAGNOSTIC STUDIES: Oxygen Saturation is 90% on RA, low by my interpretation.    COORDINATION OF CARE: 1:13 PM- Pt advised of plan for treatment and pt agrees.    Labs Review Labs Reviewed - No data to display Imaging Review No results found.  EKG Interpretation   None       MDM  No diagnosis found. Patient appears to be her normal self. She was observed in emergency department for approximately one hour. Discharge home.  I personally performed the services described in this documentation, which was scribed in my presence. The recorded information has been reviewed and is accurate.    Nat Christen, MD 09/05/13 2048

## 2013-09-09 ENCOUNTER — Encounter: Payer: Self-pay | Admitting: *Deleted

## 2013-09-17 ENCOUNTER — Encounter: Payer: Self-pay | Admitting: *Deleted

## 2013-09-17 ENCOUNTER — Encounter: Payer: Self-pay | Admitting: Obstetrics & Gynecology

## 2013-10-02 ENCOUNTER — Encounter: Payer: Self-pay | Admitting: Obstetrics & Gynecology

## 2013-10-14 ENCOUNTER — Encounter: Payer: Self-pay | Admitting: Obstetrics & Gynecology

## 2013-10-15 ENCOUNTER — Encounter (INDEPENDENT_AMBULATORY_CARE_PROVIDER_SITE_OTHER): Payer: Self-pay

## 2013-10-15 ENCOUNTER — Encounter: Payer: Self-pay | Admitting: Obstetrics & Gynecology

## 2013-10-15 ENCOUNTER — Ambulatory Visit (INDEPENDENT_AMBULATORY_CARE_PROVIDER_SITE_OTHER): Payer: Medicare Other | Admitting: Obstetrics & Gynecology

## 2013-10-15 VITALS — BP 130/60 | Ht <= 58 in | Wt 114.0 lb

## 2013-10-15 DIAGNOSIS — K921 Melena: Secondary | ICD-10-CM

## 2013-10-15 NOTE — Progress Notes (Signed)
Patient ID: Joanna Mendez, female   DOB: 1923-10-04, 80 y.o.   MRN: [id number] Pt has dementia on aricept  Pt reports she had 1 episode of blood with a stool, just 1  Had hysterectomy 30+ years ago for benign reasons No pain or other complaints  Exam Atrophic cuff intact no lesions or masses or evidence of bleeding Rectum normal tone no masses no hemorrhoids Hemoccult negative  Imp Pt reports small amount BRB per rectum with stool, hemoccult negative today and normal exam  Plan If recurs needs to see a gastroenterologist for possible olonoscopy

## 2013-10-22 ENCOUNTER — Telehealth: Payer: Self-pay

## 2013-10-22 NOTE — Telephone Encounter (Signed)
Chelsy gave me a referral on this pt for rectal bleeding and colonoscopy.  I called [facility] and spoke to med tech, [place]. Pt is already scheduled for appt on 11/21/2013 at 10:30 AM with Laban Emperor, NP. Cecelia said the pt has occasional rectal bleed, it is noticeable but not a lot. She also has some hemorrhoids.  She said she will call if things worsen prior to appt. Pt's son, who is her POA, is ware and he will accompany her to her OV.

## 2013-10-23 ENCOUNTER — Emergency Department (HOSPITAL_COMMUNITY)
Admission: EM | Admit: 2013-10-23 | Discharge: 2013-10-23 | Disposition: A | Discharge status: Home / Self Care | Payer: Medicare Other | Attending: Emergency Medicine | Admitting: Emergency Medicine

## 2013-10-23 ENCOUNTER — Encounter (HOSPITAL_COMMUNITY): Payer: Self-pay | Admitting: Emergency Medicine

## 2013-10-23 ENCOUNTER — Emergency Department (HOSPITAL_COMMUNITY): Payer: Medicare Other

## 2013-10-23 DIAGNOSIS — Z880 Allergy status to penicillin: Secondary | ICD-10-CM | POA: Insufficient documentation

## 2013-10-23 DIAGNOSIS — N183 Chronic kidney disease, stage 3 unspecified: Secondary | ICD-10-CM | POA: Insufficient documentation

## 2013-10-23 DIAGNOSIS — Z79899 Other long term (current) drug therapy: Secondary | ICD-10-CM | POA: Insufficient documentation

## 2013-10-23 DIAGNOSIS — Z8673 Personal history of transient ischemic attack (TIA), and cerebral infarction without residual deficits: Secondary | ICD-10-CM | POA: Insufficient documentation

## 2013-10-23 DIAGNOSIS — F411 Generalized anxiety disorder: Secondary | ICD-10-CM | POA: Insufficient documentation

## 2013-10-23 DIAGNOSIS — J449 Chronic obstructive pulmonary disease, unspecified: Secondary | ICD-10-CM | POA: Insufficient documentation

## 2013-10-23 DIAGNOSIS — J4489 Other specified chronic obstructive pulmonary disease: Secondary | ICD-10-CM | POA: Insufficient documentation

## 2013-10-23 DIAGNOSIS — E785 Hyperlipidemia, unspecified: Secondary | ICD-10-CM | POA: Insufficient documentation

## 2013-10-23 DIAGNOSIS — D649 Anemia, unspecified: Secondary | ICD-10-CM | POA: Insufficient documentation

## 2013-10-23 DIAGNOSIS — I129 Hypertensive chronic kidney disease with stage 1 through stage 4 chronic kidney disease, or unspecified chronic kidney disease: Secondary | ICD-10-CM | POA: Insufficient documentation

## 2013-10-23 DIAGNOSIS — Z8709 Personal history of other diseases of the respiratory system: Secondary | ICD-10-CM | POA: Insufficient documentation

## 2013-10-23 DIAGNOSIS — Z8739 Personal history of other diseases of the musculoskeletal system and connective tissue: Secondary | ICD-10-CM | POA: Insufficient documentation

## 2013-10-23 DIAGNOSIS — K625 Hemorrhage of anus and rectum: Secondary | ICD-10-CM | POA: Insufficient documentation

## 2013-10-23 DIAGNOSIS — F0280 Dementia in other diseases classified elsewhere without behavioral disturbance: Secondary | ICD-10-CM | POA: Insufficient documentation

## 2013-10-23 DIAGNOSIS — G309 Alzheimer's disease, unspecified: Secondary | ICD-10-CM | POA: Insufficient documentation

## 2013-10-23 LAB — COMPREHENSIVE METABOLIC PANEL
ALBUMIN: 3.4 g/dL — AB (ref 3.5–5.2)
ALK PHOS: 74 U/L (ref 39–117)
ALT: 15 U/L (ref 0–35)
AST: 24 U/L (ref 0–37)
BUN: 34 mg/dL — ABNORMAL HIGH (ref 6–23)
CALCIUM: 9.5 mg/dL (ref 8.4–10.5)
CO2: 25 mEq/L (ref 19–32)
Chloride: 106 mEq/L (ref 96–112)
Creatinine, Ser: 1.23 mg/dL — ABNORMAL HIGH (ref 0.50–1.10)
GFR calc Af Amer: 44 mL/min — ABNORMAL LOW (ref >90)
GFR calc non Af Amer: 38 mL/min — ABNORMAL LOW (ref >90)
GLUCOSE: 90 mg/dL (ref 70–99)
POTASSIUM: 4.7 meq/L (ref 3.7–5.3)
SODIUM: 142 meq/L (ref 137–147)
TOTAL PROTEIN: 7.1 g/dL (ref 6.0–8.3)
Total Bilirubin: <0.2 mg/dL — ABNORMAL LOW (ref 0.3–1.2)

## 2013-10-23 LAB — TYPE AND SCREEN
ABO/RH(D): O POS
ANTIBODY SCREEN: NEGATIVE

## 2013-10-23 LAB — POC OCCULT BLOOD, ED: FECAL OCCULT BLD: POSITIVE — AB

## 2013-10-23 LAB — LACTIC ACID, PLASMA: Lactic Acid, Venous: 2.1 mmol/L (ref 0.5–2.2)

## 2013-10-23 LAB — CBC WITH DIFFERENTIAL/PLATELET
BASOS ABS: 0 10*3/uL (ref 0.0–0.1)
BASOS PCT: 1 % (ref 0–1)
EOS ABS: 0.2 10*3/uL (ref 0.0–0.7)
Eosinophils Relative: 3 % (ref 0–5)
HCT: 40.5 % (ref 36.0–46.0)
Hemoglobin: 13.4 g/dL (ref 12.0–15.0)
LYMPHS ABS: 1.7 10*3/uL (ref 0.7–4.0)
Lymphocytes Relative: 29 % (ref 12–46)
MCH: 34.1 pg — AB (ref 26.0–34.0)
MCHC: 33.1 g/dL (ref 30.0–36.0)
MCV: 103.1 fL — ABNORMAL HIGH (ref 78.0–100.0)
Monocytes Absolute: 0.6 10*3/uL (ref 0.1–1.0)
Monocytes Relative: 10 % (ref 3–12)
NEUTROS PCT: 57 % (ref 43–77)
Neutro Abs: 3.4 10*3/uL (ref 1.7–7.7)
PLATELETS: 276 10*3/uL (ref 150–400)
RBC: 3.93 MIL/uL (ref 3.87–5.11)
RDW: 14.8 % (ref 11.5–15.5)
WBC: 5.9 10*3/uL (ref 4.0–10.5)

## 2013-10-23 NOTE — ED Notes (Signed)
DNR sent back with patient

## 2013-10-23 NOTE — ED Provider Notes (Signed)
CSN: [id number]     Arrival date & time 10/23/13  1807 History   First MD Initiated Contact with Patient 10/23/13 1808     Chief Complaint  Patient presents with  . Rectal Bleeding     (Consider location/radiation/quality/duration/timing/severity/associated sxs/prior Treatment) HPI Comments: Patient presents to the ER for evaluation of rectal bleeding. Patient reports that she had an urge to have bowel movement this evening, went to the bathroom and passed red blood. She has not had any abdominal pain. Patient denies chest pain, shortness of breath. There has been no nausea, vomiting, diarrhea or constipation. Patient denies palpitations, dizziness and passing out.  Patient is a 80 y.o. female presenting with hematochezia.  Rectal Bleeding   Past Medical History  Diagnosis Date  . Alzheimer disease   . Hypertension   . Anxiety   . Hyperlipidemia   . Osteoarthritis   . Allergic rhinitis   . Osteoporosis   . Anemia   . COPD (chronic obstructive pulmonary disease)     chronic bronchitis  . Chronic kidney disease     stage III  . Stroke 01/31/2013    TIA   Past Surgical History  Procedure Laterality Date  . Unable    . Tonsillectomy    . Knee surgery     Family History  Problem Relation Age of Onset  . Dementia Mother   . Dementia Maternal Grandmother    History  Substance Use Topics  . Smoking status: Never Smoker   . Smokeless tobacco: Never Used  . Alcohol Use: No   OB History   Grav Para Term Preterm Abortions TAB SAB Ect Mult Living                 Review of Systems  Gastrointestinal: Positive for hematochezia and anal bleeding.  All other systems reviewed and are negative.      Allergies  Penicillins  Home Medications   Current Outpatient Rx  Name  Route  Sig  Dispense  Refill  . acetaminophen (TYLENOL) 500 MG tablet   Oral   Take 500 mg by mouth 3 (three) times daily.         Marland Kitchen atorvastatin (LIPITOR) 10 MG tablet   Oral   Take 5 mg by  mouth daily.          . Calcium Citrate-Vitamin D (CITRACAL/VITAMIN D) 250-200 MG-UNIT TABS   Oral   Take 1 tablet by mouth 3 (three) times daily.         . Cranberry 475 MG CAPS   Oral   Take 475 mg by mouth 2 (two) times daily.         Marland Kitchen donepezil (ARICEPT) 10 MG tablet   Oral   Take 10 mg by mouth at bedtime.         . ferrous sulfate 325 (65 FE) MG tablet   Oral   Take 325 mg by mouth daily with breakfast.         . Lycopene 10 MG CAPS   Oral   Take 1 capsule by mouth daily.         . Melatonin 5 MG CAPS   Oral   Take 5 mg by mouth at bedtime.         . mirtazapine (REMERON) 15 MG tablet   Oral   Take 7.5 mg by mouth at bedtime.          Marland Kitchen albuterol (PROVENTIL HFA;VENTOLIN HFA) 108 (90 BASE) MCG/ACT inhaler  Inhalation   Inhale 2 puffs into the lungs every 4 (four) hours as needed for wheezing.         . zinc oxide 20 % ointment   Topical   Apply 1 application topically 3 (three) times daily as needed for irritation.          BP 135/64  Pulse 80  Temp(Src) 97.9 F (36.6 C) (Oral)  Resp 18  SpO2 96% Physical Exam  Constitutional: She is oriented to person, place, and time. She appears well-developed and well-nourished. No distress.  HENT:  Head: Normocephalic and atraumatic.  Right Ear: Hearing normal.  Left Ear: Hearing normal.  Nose: Nose normal.  Mouth/Throat: Oropharynx is clear and moist and mucous membranes are normal.  Eyes: Conjunctivae and EOM are normal. Pupils are equal, round, and reactive to light.  Neck: Normal range of motion. Neck supple.  Cardiovascular: Regular rhythm, S1 normal and S2 normal.  Exam reveals no gallop and no friction rub.   No murmur heard. Pulmonary/Chest: Effort normal and breath sounds normal. No respiratory distress. She exhibits no tenderness.  Abdominal: Soft. Normal appearance and bowel sounds are normal. There is no hepatosplenomegaly. There is no tenderness. There is no rebound, no guarding,  no tenderness at McBurney's point and negative Murphy's sign. No hernia.  Genitourinary: Guaiac positive stool (Small amount of maroon blood present, guaiac positive).  Musculoskeletal: Normal range of motion.  Neurological: She is alert and oriented to person, place, and time. She has normal strength. No cranial nerve deficit or sensory deficit. Coordination normal. GCS eye subscore is 4. GCS verbal subscore is 5. GCS motor subscore is 6.  Skin: Skin is warm, dry and intact. No rash noted. No cyanosis.  Psychiatric: She has a normal mood and affect. Her speech is normal and behavior is normal. Thought content normal.    ED Course  Procedures (including critical care time) Labs Review Labs Reviewed  CBC WITH DIFFERENTIAL - Abnormal; Notable for the following:    MCV 103.1 (*)    MCH 34.1 (*)    All other components within normal limits  COMPREHENSIVE METABOLIC PANEL - Abnormal; Notable for the following:    BUN 34 (*)    Creatinine, Ser 1.23 (*)    Albumin 3.4 (*)    Total Bilirubin <0.2 (*)    GFR calc non Af Amer 38 (*)    GFR calc Af Amer 44 (*)    All other components within normal limits  POC OCCULT BLOOD, ED - Abnormal; Notable for the following:    Fecal Occult Bld POSITIVE (*)    All other components within normal limits  LACTIC ACID, PLASMA  TYPE AND SCREEN   Imaging Review Dg Abd Acute W/chest  10/23/2013   CLINICAL DATA Abdominal pain and rectal bleeding which began tonight.  EXAM ACUTE ABDOMEN SERIES (ABDOMEN 2 VIEW & CHEST 1 VIEW)  COMPARISON A prior abdominal imaging. Portable chest x-ray 11/27/2011, 11/19/2011.  FINDINGS Bowel gas pattern unremarkable without evidence of obstruction or significant ileus. No evidence of free air or significant air-fluid levels on the erect image. Moderate stool burden in the colon. Calcifications projected over the upper and lower poles of the right kidney. Phleboliths in the pelvis. No visible right ureteral calculi. No visible opaque  left urinary tract calculi. Severe degenerative changes involving the thoracic and lumbar spine.  Cardiac silhouette mildly enlarged but stable. Thoracic aorta tortuous and severely atherosclerotic, unchanged. Hilar and mediastinal contours otherwise unremarkable. Lungs clear. Bronchovascular markings  normal. Pulmonary vascularity normal. No visible pleural effusions. No pneumothorax. Calcified tracheobronchial cartilages.  IMPRESSION 1. No acute abdominal abnormality. 2. Probable small calculi in upper and lower pole calices of the right kidney. No visible opaque right ureteral calculi. 3. Stable mild cardiomegaly.  No acute cardiopulmonary disease.  SIGNATURE  Electronically Signed   By: Evangeline Dakin M.D.   On: 10/23/2013 19:23     EKG Interpretation None      MDM   Final diagnoses:  None    Patient presents to the ER for evaluation of rectal bleeding. She had one episode of passage of blood tonight. Rectal exam revealed a very faint amount of maroon stool in the vault, has not had any ongoing bleeding. Vital signs are normal. Hemoglobin is normal. Further discussion with the patient's son reveals that she has followup scheduled for colonoscopy scheduled next month. Patient will therefore be returned to [place], followup colonoscopy as scheduled. If there is any increased bleeding,  return for repeat evaluation.    Orpah Greek, MD 10/23/13 [phone]

## 2013-10-23 NOTE — ED Notes (Signed)
MD at bedside. 

## 2013-10-23 NOTE — Discharge Instructions (Signed)
Gastrointestinal Bleeding °Gastrointestinal (GI) bleeding means there is bleeding somewhere along the digestive tract, between the mouth and anus. °CAUSES  °There are many different problems that can cause GI bleeding. Possible causes include: °· Esophagitis. This is inflammation, irritation, or swelling of the esophagus. °· Hemorrhoids. These are veins that are full of blood (engorged) in the rectum. They cause pain, inflammation, and may bleed. °· Anal fissures. These are areas of painful tearing which may bleed. They are often caused by passing hard stool. °· Diverticulosis. These are pouches that form on the colon over time, with age, and may bleed significantly. °· Diverticulitis. This is inflammation in areas with diverticulosis. It can cause pain, fever, and bloody stools, although bleeding is rare. °· Polyps and cancer. Colon cancer often starts out as precancerous polyps. °· Gastritis and ulcers. Bleeding from the upper gastrointestinal tract (near the stomach) may travel through the intestines and produce black, sometimes tarry, often bad smelling stools. In certain cases, if the bleeding is fast enough, the stools may not be black, but red. This condition may be life-threatening. °SYMPTOMS  °· Vomiting bright red blood or material that looks like coffee grounds. °· Bloody, black, or tarry stools. °DIAGNOSIS  °Your caregiver may diagnose your condition by taking your history and performing a physical exam. More tests may be needed, including: °· X-rays and other imaging tests. °· Esophagogastroduodenoscopy (EGD). This test uses a flexible, lighted tube to look at your esophagus, stomach, and small intestine. °· Colonoscopy. This test uses a flexible, lighted tube to look at your colon. °TREATMENT  °Treatment depends on the cause of your bleeding.  °· For bleeding from the esophagus, stomach, small intestine, or colon, the caregiver doing your EGD or colonoscopy may be able to stop the bleeding as part of  the procedure. °· Inflammation or infection of the colon can be treated with medicines. °· Many rectal problems can be treated with creams, suppositories, or warm baths. °· Surgery is sometimes needed. °· Blood transfusions are sometimes needed if you have lost a lot of blood. °If bleeding is slow, you may be allowed to go home. If there is a lot of bleeding, you will need to stay in the hospital for observation. °HOME CARE INSTRUCTIONS  °· Take any medicines exactly as prescribed. °· Keep your stools soft by eating foods that are high in fiber. These foods include whole grains, legumes, fruits, and vegetables. Prunes (1 to 3 a day) work well for many people. °· Drink enough fluids to keep your urine clear or pale yellow. °SEEK IMMEDIATE MEDICAL CARE IF:  °· Your bleeding increases. °· You feel lightheaded, weak, or you faint. °· You have severe cramps in your back or abdomen. °· You pass large blood clots in your stool. °· Your problems are getting worse. °MAKE SURE YOU:  °· Understand these instructions. °· Will watch your condition. °· Will get help right away if you are not doing well or get worse. °Document Released: 07/29/2000 Document Revised: 07/18/2012 Document Reviewed: 07/11/2011 °ExitCare® Patient Information ©2014 [employer]. ° °

## 2013-10-23 NOTE — ED Notes (Signed)
Pt from [facility].  Pt reports bright red blood in her stool that began this evening.  Pt denies any pain to her abdomen.

## 2013-11-07 ENCOUNTER — Ambulatory Visit: Payer: Medicare Other | Admitting: Gastroenterology

## 2013-11-21 ENCOUNTER — Telehealth: Payer: Self-pay | Admitting: Gastroenterology

## 2013-11-21 ENCOUNTER — Encounter (HOSPITAL_COMMUNITY): Payer: Self-pay | Admitting: Emergency Medicine

## 2013-11-21 ENCOUNTER — Ambulatory Visit: Payer: Medicare Other | Admitting: Gastroenterology

## 2013-11-21 ENCOUNTER — Emergency Department (HOSPITAL_COMMUNITY)
Admission: EM | Admit: 2013-11-21 | Discharge: 2013-11-21 | Disposition: A | Discharge status: Home / Self Care | Payer: Medicare Other | Attending: Emergency Medicine | Admitting: Emergency Medicine

## 2013-11-21 DIAGNOSIS — J449 Chronic obstructive pulmonary disease, unspecified: Secondary | ICD-10-CM | POA: Insufficient documentation

## 2013-11-21 DIAGNOSIS — E785 Hyperlipidemia, unspecified: Secondary | ICD-10-CM | POA: Insufficient documentation

## 2013-11-21 DIAGNOSIS — D649 Anemia, unspecified: Secondary | ICD-10-CM | POA: Insufficient documentation

## 2013-11-21 DIAGNOSIS — Z79899 Other long term (current) drug therapy: Secondary | ICD-10-CM | POA: Insufficient documentation

## 2013-11-21 DIAGNOSIS — F0280 Dementia in other diseases classified elsewhere without behavioral disturbance: Secondary | ICD-10-CM | POA: Insufficient documentation

## 2013-11-21 DIAGNOSIS — F411 Generalized anxiety disorder: Secondary | ICD-10-CM | POA: Insufficient documentation

## 2013-11-21 DIAGNOSIS — J4489 Other specified chronic obstructive pulmonary disease: Secondary | ICD-10-CM | POA: Insufficient documentation

## 2013-11-21 DIAGNOSIS — G309 Alzheimer's disease, unspecified: Secondary | ICD-10-CM | POA: Insufficient documentation

## 2013-11-21 DIAGNOSIS — Z8673 Personal history of transient ischemic attack (TIA), and cerebral infarction without residual deficits: Secondary | ICD-10-CM | POA: Insufficient documentation

## 2013-11-21 DIAGNOSIS — IMO0002 Reserved for concepts with insufficient information to code with codable children: Secondary | ICD-10-CM | POA: Insufficient documentation

## 2013-11-21 DIAGNOSIS — S0081XA Abrasion of other part of head, initial encounter: Secondary | ICD-10-CM

## 2013-11-21 DIAGNOSIS — Z880 Allergy status to penicillin: Secondary | ICD-10-CM | POA: Insufficient documentation

## 2013-11-21 DIAGNOSIS — N183 Chronic kidney disease, stage 3 unspecified: Secondary | ICD-10-CM | POA: Insufficient documentation

## 2013-11-21 DIAGNOSIS — M1990 Unspecified osteoarthritis, unspecified site: Secondary | ICD-10-CM | POA: Insufficient documentation

## 2013-11-21 DIAGNOSIS — I129 Hypertensive chronic kidney disease with stage 1 through stage 4 chronic kidney disease, or unspecified chronic kidney disease: Secondary | ICD-10-CM | POA: Insufficient documentation

## 2013-11-21 NOTE — ED Notes (Signed)
Discharge instructions reviewed with pt, questions answered. Pt verbalized understanding.  

## 2013-11-21 NOTE — Telephone Encounter (Signed)
Please send note

## 2013-11-21 NOTE — Telephone Encounter (Signed)
Pt was a no show
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
## 2013-11-21 NOTE — Discharge Instructions (Signed)
Keep the abrasion clean, you can use triple antibiotic ointment if you wish on the abrasion. She should be monitored for any problems listed on the head injury sheet and return to the ED if they develop.
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
## 2013-11-25 ENCOUNTER — Encounter: Payer: Self-pay | Admitting: Gastroenterology

## 2013-11-25 NOTE — Telephone Encounter (Signed)
Mailed letter °

## 2013-12-30 ENCOUNTER — Encounter (INDEPENDENT_AMBULATORY_CARE_PROVIDER_SITE_OTHER): Payer: Self-pay

## 2013-12-30 ENCOUNTER — Encounter: Payer: Self-pay | Admitting: Gastroenterology

## 2013-12-30 ENCOUNTER — Ambulatory Visit (INDEPENDENT_AMBULATORY_CARE_PROVIDER_SITE_OTHER): Payer: Medicare Other | Admitting: Gastroenterology

## 2013-12-30 VITALS — BP 120/69 | HR 72 | Temp 97.3°F | Ht 60.0 in | Wt 112.4 lb

## 2013-12-30 DIAGNOSIS — K625 Hemorrhage of anus and rectum: Secondary | ICD-10-CM | POA: Insufficient documentation

## 2013-12-30 MED ORDER — PEG 3350-KCL-NA BICARB-NACL 420 G PO SOLR
4000.0000 mL | ORAL | Status: DC
Start: 2013-12-30 — End: 2018-03-21

## 2013-12-30 MED ORDER — HYDROCORTISONE 2.5 % RE CREA: 1.0000 "application " | TOPICAL_CREAM | Freq: Two times a day (BID) | RECTAL | Status: DC

## 2013-12-30 NOTE — Patient Instructions (Signed)
We have scheduled you for a colonoscopy with Dr. Gala Romney.   I have sent a cream to the pharmacy to use twice a day for 7 days per rectum.   Further recommendations to follow.

## 2013-12-30 NOTE — Assessment & Plan Note (Signed)
80 year old female with low-volume hematochezia but no other concerning signs, with last colonoscopy in the very distant past. Son present to validate and provide additional information. Although she has a history of dementia, she is otherwise fairly healthy and without significant physical limitations. I discussed the risks and benefits of a colonoscopy with patient and son; both would like to proceed to assess for any occult GI issues. Likely benign anorectal source.   Proceed with TCS with Dr. Gala Romney in near future: the risks, benefits, and alternatives have been discussed with the patient in detail. The patient states understanding and desires to proceed. Anusol BID X 7 days

## 2013-12-30 NOTE — Progress Notes (Signed)
Primary Care Physician:  Gar Ponto, MD Primary Gastroenterologist:  Dr. Gala Romney  Chief Complaint  Patient presents with  . Colonoscopy    HPI:   Joanna Mendez presents today with a history of Alzheimer's dementia but otherwise fairly healthy, appearing younger than stated age. She presents due to rectal bleeding at the request of [place]. Patient is unsure if she has seen blood or not. Son present. Denies abdominal pain. Denies constipation or diarrhea. Son believes she may have had a colonoscopy many years ago. Patient denies this. Son states that the [place] denies any bleeding for a few weeks now. Taking Anusol suppositories prn. Appears incidence was low-volume hematochezia.    Past Medical History  Diagnosis Date  . Alzheimer disease   . Hypertension   . Anxiety   . Hyperlipidemia   . Osteoarthritis   . Allergic rhinitis   . Osteoporosis   . Anemia   . COPD (chronic obstructive pulmonary disease)     chronic bronchitis  . Chronic kidney disease     stage III  . Stroke 01/31/2013    TIA    Past Surgical History  Procedure Laterality Date  . Tonsillectomy    . Knee surgery    . Abdominal hysterectomy      Current Outpatient Prescriptions  Medication Sig Dispense Refill  . acetaminophen (TYLENOL) 500 MG tablet Take 500 mg by mouth 3 (three) times daily.      Marland Kitchen albuterol (PROVENTIL HFA;VENTOLIN HFA) 108 (90 BASE) MCG/ACT inhaler Inhale 2 puffs into the lungs every 4 (four) hours as needed for wheezing.      Marland Kitchen atorvastatin (LIPITOR) 10 MG tablet Take 5 mg by mouth daily.       . Calcium Citrate-Vitamin D (CITRACAL/VITAMIN D) 250-200 MG-UNIT TABS Take 1 tablet by mouth 3 (three) times daily.      . Cranberry 475 MG CAPS Take 475 mg by mouth 2 (two) times daily.      Marland Kitchen donepezil (ARICEPT) 10 MG tablet Take 10 mg by mouth at bedtime.      . ferrous sulfate 325 (65 FE) MG tablet Take 325 mg by mouth daily with breakfast.      . hydrocortisone  (ANUSOL-HC) 25 MG suppository Place 25 mg rectally 2 (two) times daily.      . Melatonin 5 MG CAPS Take 5 mg by mouth at bedtime.      . mirtazapine (REMERON) 15 MG tablet Take 7.5 mg by mouth at bedtime.       Marland Kitchen zinc oxide 20 % ointment Apply 1 application topically 3 (three) times daily as needed for irritation.       No current facility-administered medications for this visit.    Allergies as of 12/30/2013 - Review Complete 12/30/2013  Allergen Reaction Noted  . Penicillins  09/09/2013    Family History  Problem Relation Age of Onset  . Dementia Mother   . Dementia Maternal Grandmother   . Colon cancer Neg Hx     History   Social History  . Marital Status: Widowed    Spouse Name: N/A    Number of Children: N/A  . Years of Education: N/A   Occupational History  . Not on file.   Social History Main Topics  . Smoking status: Never Smoker   . Smokeless tobacco: Never Used  . Alcohol Use: No  . Drug Use: No  . Sexual Activity: Not Currently   Other Topics Concern  .  Not on file   Social History Narrative  . No narrative on file    Review of Systems: As mentioned in HPI  Physical Exam: BP 120/69  Pulse 72  Temp(Src) 97.3 F (36.3 C) (Oral)  Ht 5' (1.524 m)  Wt 112 lb 6.4 oz (50.984 kg)  BMI 21.95 kg/m2 General:   Alert and oriented to person only Head:  Normocephalic and atraumatic. Eyes:  Without icterus, sclera clear and conjunctiva pink.  Ears:  Normal auditory acuity. Nose:  No deformity, discharge,  or lesions. Mouth:  No deformity or lesions, oral mucosa pink.  Lungs:  Clear to auscultation bilaterally. No wheezes, rales, or rhonchi. No distress.  Heart:  S1, S2 present with question of soft systolic murmur Abdomen:  +BS, soft, non-tender and non-distended. No HSM noted. No guarding or rebound. No masses appreciated.  Rectal:  Non-thrombosed external hemorrhoids. Internal exam without mass or stricture, no gross blood appreciated.  Extremities:   Without edema. Neurologic:  Alert and  oriented to person only Skin:  Intact without significant lesions or rashes. Psych:  Alert and cooperative. Normal mood and affect.

## 2013-12-31 NOTE — Progress Notes (Signed)
cc'd to pcp 

## 2014-01-01 ENCOUNTER — Encounter (HOSPITAL_COMMUNITY): Payer: Self-pay | Admitting: Pharmacy Technician

## 2014-01-14 ENCOUNTER — Other Ambulatory Visit: Payer: Self-pay | Admitting: Internal Medicine

## 2014-01-15 ENCOUNTER — Encounter (HOSPITAL_COMMUNITY): Payer: Self-pay | Admitting: *Deleted

## 2014-01-15 ENCOUNTER — Encounter (HOSPITAL_COMMUNITY)
Admission: RE | Disposition: A | Discharge status: Skilled Nursing Facility | Payer: Self-pay | Source: Ambulatory Visit | Attending: Internal Medicine

## 2014-01-15 ENCOUNTER — Ambulatory Visit (HOSPITAL_COMMUNITY)
Admission: RE | Admit: 2014-01-15 | Discharge: 2014-01-15 | Disposition: A | Discharge status: Skilled Nursing Facility | Payer: Medicare Other | Source: Ambulatory Visit | Attending: Internal Medicine | Admitting: Internal Medicine

## 2014-01-15 DIAGNOSIS — K625 Hemorrhage of anus and rectum: Secondary | ICD-10-CM | POA: Diagnosis not present

## 2014-01-15 DIAGNOSIS — Z880 Allergy status to penicillin: Secondary | ICD-10-CM | POA: Insufficient documentation

## 2014-01-15 DIAGNOSIS — J449 Chronic obstructive pulmonary disease, unspecified: Secondary | ICD-10-CM | POA: Insufficient documentation

## 2014-01-15 DIAGNOSIS — K921 Melena: Secondary | ICD-10-CM | POA: Insufficient documentation

## 2014-01-15 DIAGNOSIS — D649 Anemia, unspecified: Secondary | ICD-10-CM | POA: Insufficient documentation

## 2014-01-15 DIAGNOSIS — K644 Residual hemorrhoidal skin tags: Secondary | ICD-10-CM | POA: Insufficient documentation

## 2014-01-15 DIAGNOSIS — K5730 Diverticulosis of large intestine without perforation or abscess without bleeding: Secondary | ICD-10-CM | POA: Insufficient documentation

## 2014-01-15 DIAGNOSIS — N183 Chronic kidney disease, stage 3 unspecified: Secondary | ICD-10-CM | POA: Insufficient documentation

## 2014-01-15 DIAGNOSIS — Z8673 Personal history of transient ischemic attack (TIA), and cerebral infarction without residual deficits: Secondary | ICD-10-CM | POA: Insufficient documentation

## 2014-01-15 DIAGNOSIS — J4489 Other specified chronic obstructive pulmonary disease: Secondary | ICD-10-CM | POA: Insufficient documentation

## 2014-01-15 DIAGNOSIS — K5120 Ulcerative (chronic) proctitis without complications: Secondary | ICD-10-CM | POA: Insufficient documentation

## 2014-01-15 DIAGNOSIS — J309 Allergic rhinitis, unspecified: Secondary | ICD-10-CM | POA: Insufficient documentation

## 2014-01-15 DIAGNOSIS — G309 Alzheimer's disease, unspecified: Secondary | ICD-10-CM | POA: Insufficient documentation

## 2014-01-15 DIAGNOSIS — M810 Age-related osteoporosis without current pathological fracture: Secondary | ICD-10-CM | POA: Insufficient documentation

## 2014-01-15 DIAGNOSIS — Z86010 Personal history of colonic polyps: Secondary | ICD-10-CM | POA: Diagnosis not present

## 2014-01-15 DIAGNOSIS — K626 Ulcer of anus and rectum: Secondary | ICD-10-CM | POA: Diagnosis not present

## 2014-01-15 DIAGNOSIS — D126 Benign neoplasm of colon, unspecified: Secondary | ICD-10-CM | POA: Insufficient documentation

## 2014-01-15 DIAGNOSIS — I129 Hypertensive chronic kidney disease with stage 1 through stage 4 chronic kidney disease, or unspecified chronic kidney disease: Secondary | ICD-10-CM | POA: Insufficient documentation

## 2014-01-15 DIAGNOSIS — Z79899 Other long term (current) drug therapy: Secondary | ICD-10-CM | POA: Insufficient documentation

## 2014-01-15 DIAGNOSIS — F0280 Dementia in other diseases classified elsewhere without behavioral disturbance: Secondary | ICD-10-CM | POA: Insufficient documentation

## 2014-01-15 DIAGNOSIS — M1990 Unspecified osteoarthritis, unspecified site: Secondary | ICD-10-CM | POA: Insufficient documentation

## 2014-01-15 DIAGNOSIS — E785 Hyperlipidemia, unspecified: Secondary | ICD-10-CM | POA: Insufficient documentation

## 2014-01-15 DIAGNOSIS — F411 Generalized anxiety disorder: Secondary | ICD-10-CM | POA: Insufficient documentation

## 2014-01-15 HISTORY — PX: COLONOSCOPY: SHX5424

## 2014-01-15 SURGERY — COLONOSCOPY
Anesthesia: Moderate Sedation

## 2014-01-15 MED ORDER — MEPERIDINE HCL 100 MG/ML IJ SOLN
INTRAMUSCULAR | Status: AC
  Filled 2014-01-15: qty 2

## 2014-01-15 MED ORDER — MIDAZOLAM HCL 5 MG/5ML IJ SOLN
INTRAMUSCULAR | Status: DC | PRN
  Administered 2014-01-15 (×2): 1 mg via INTRAVENOUS

## 2014-01-15 MED ORDER — MIDAZOLAM HCL 5 MG/5ML IJ SOLN
INTRAMUSCULAR | Status: AC
  Filled 2014-01-15: qty 10

## 2014-01-15 MED ORDER — ONDANSETRON HCL 4 MG/2ML IJ SOLN
INTRAMUSCULAR | Status: DC | PRN
  Administered 2014-01-15: 4 mg via INTRAMUSCULAR

## 2014-01-15 MED ORDER — MEPERIDINE HCL 100 MG/ML IJ SOLN
INTRAMUSCULAR | Status: DC | PRN
  Administered 2014-01-15 (×2): 25 mg via INTRAVENOUS

## 2014-01-15 MED ORDER — ONDANSETRON HCL 4 MG/2ML IJ SOLN
INTRAMUSCULAR | Status: AC
  Filled 2014-01-15: qty 2

## 2014-01-15 MED ORDER — SODIUM CHLORIDE 0.9 % IV SOLN: INTRAVENOUS | Status: DC

## 2014-01-15 NOTE — Interval H&P Note (Signed)
History and Physical Interval Note:  01/15/2014 4:02 PM  Joanna Mendez  has presented today for surgery, with the diagnosis of RECTAL BLEEDING  The various methods of treatment have been discussed with the patient and family. After consideration of risks, benefits and other options for treatment, the patient has consented to  Procedure(s) with comments: COLONOSCOPY (N/A) - 8:30AM as a surgical intervention .  The patient's history has been reviewed, patient examined, no change in status, stable for surgery.  I have reviewed the patient's chart and labs.  Questions were answered to the patient's satisfaction.   No change. Colonoscopy per plan.  Cristopher Estimable Daleen Steinhaus

## 2014-01-15 NOTE — Discharge Instructions (Addendum)
Constipation, polyp and diverticulosis information provided  It is important to avoid constipation  My recommendations are as follows:  Colace 100 mg twice daily  Benefiber 2 teaspoons twice daily  MiraLax 17 g at bedtime as needed for constipation  [place] 1 g mesalamine suppository one per rectum at bedtime x1 month only   Further recommendations to follow pending review of pathology               Colonoscopy Discharge Instructions  Read the instructions outlined below and refer to this sheet in the next few weeks. These discharge instructions provide you with general information on caring for yourself after you leave the hospital. Your doctor may also give you specific instructions. While your treatment has been planned according to the most current medical practices available, unavoidable complications occasionally occur. If you have any problems or questions after discharge, call Dr. Gala Romney at [phone]. ACTIVITY  You may resume your regular activity, but move at a slower pace for the next 24 hours.   Take frequent rest periods for the next 24 hours.   Walking will help get rid of the air and reduce the bloated feeling in your belly (abdomen).   No driving for 24 hours (because of the medicine (anesthesia) used during the test).    Do not sign any important legal documents or operate any machinery for 24 hours (because of the anesthesia used during the test).  NUTRITION  Drink plenty of fluids.   You may resume your normal diet as instructed by your doctor.   Begin with a light meal and progress to your normal diet. Heavy or fried foods are harder to digest and may make you feel sick to your stomach (nauseated).   Avoid alcoholic beverages for 24 hours or as instructed.  MEDICATIONS  You may resume your normal medications unless your doctor tells you otherwise.  WHAT YOU CAN EXPECT TODAY  Some feelings of bloating in the abdomen.   Passage of more gas than usual.    Spotting of blood in your stool or on the toilet paper.  IF YOU HAD POLYPS REMOVED DURING THE COLONOSCOPY:  No aspirin products for 7 days or as instructed.   No alcohol for 7 days or as instructed.   Eat a soft diet for the next 24 hours.  FINDING OUT THE RESULTS OF YOUR TEST Not all test results are available during your visit. If your test results are not back during the visit, make an appointment with your caregiver to find out the results. Do not assume everything is normal if you have not heard from your caregiver or the medical facility. It is important for you to follow up on all of your test results.  SEEK IMMEDIATE MEDICAL ATTENTION IF:  You have more than a s  ominal distention).   You are nauseated or vomiting.   You have a temperature over 101.   You have abdominal pain or discomfort that is severe or gets worse throughout the day.     Constipation, Adult Constipation is when a person has fewer than 3 bowel movements a week; has difficulty having a bowel movement; or has stools that are dry, hard, or larger than normal. As people grow older, constipation is more common. If you try to fix constipation with medicines that make you have a bowel movement (laxatives), the problem may get worse. Long-term laxative use may cause the muscles of the colon to become weak. A low-fiber diet, not taking in  enough fluids, and taking certain medicines may make constipation worse. CAUSES   Certain medicines, such as antidepressants, pain medicine, iron supplements, antacids, and water pills.   Certain diseases, such as diabetes, irritable bowel syndrome (IBS), thyroid disease, or depression.   Not drinking enough water.   Not eating enough fiber-rich foods.   Stress or travel.  Lack of physical activity or exercise.  Not going to the restroom when there is the urge to have a bowel movement.  Ignoring the urge to have a bowel movement.  Using laxatives too  much. SYMPTOMS   Having fewer than 3 bowel movements a week.   Straining to have a bowel movement.   Having hard, dry, or larger than normal stools.   Feeling full or bloated.   Pain in the lower abdomen.  Not feeling relief after having a bowel movement. DIAGNOSIS  Your caregiver will take a medical history and perform a physical exam. Further testing may be done for severe constipation. Some tests may include:   A barium enema X-ray to examine your rectum, colon, and sometimes, your small intestine.  A sigmoidoscopy to examine your lower colon.  A colonoscopy to examine your entire colon. TREATMENT  Treatment will depend on the severity of your constipation and what is causing it. Some dietary treatments include drinking more fluids and eating more fiber-rich foods. Lifestyle treatments may include regular exercise. If these diet and lifestyle recommendations do not help, your caregiver may recommend taking over-the-counter laxative medicines to help you have bowel movements. Prescription medicines may be prescribed if over-the-counter medicines do not work.  HOME CARE INSTRUCTIONS   Increase dietary fiber in your diet, such as fruits, vegetables, whole grains, and beans. Limit high-fat and processed sugars in your diet, such as [place] fries, hamburgers, cookies, candies, and soda.   A fiber supplement may be added to your diet if you cannot get enough fiber from foods.   Drink enough fluids to keep your urine clear or pale yellow.   Exercise regularly or as directed by your caregiver.   Go to the restroom when you have the urge to go. Do not hold it.  Only take medicines as directed by your caregiver. Do not take other medicines for constipation without talking to your caregiver first. [place] IF:   You have bright red blood in your stool.   Your constipation lasts for more than 4 days or gets worse.   You have abdominal or rectal pain.    You have thin, pencil-like stools.  You have unexplained weight loss. MAKE SURE YOU:   Understand these instructions.  Will watch your condition.  Will get help right away if you are not doing well or get worse. Document Released: 04/29/2004 Document Revised: 10/24/2011 Document Reviewed: 05/13/2013 [facility] Patient Information 2014 [place], [place].                                                             Diverticulosis Diverticulosis is a common condition that develops when small pouches (diverticula) form in the wall of the colon. The risk of diverticulosis increases with age. It happens more often in people who eat a low-fiber diet. Most individuals with diverticulosis have no symptoms. Those individuals with symptoms usually experience abdominal pain, constipation, or loose  stools (diarrhea). HOME CARE INSTRUCTIONS   Increase the amount of fiber in your diet as directed by your caregiver or dietician. This may reduce symptoms of diverticulosis.  Your caregiver may recommend taking a dietary fiber supplement.  Drink at least 6 to 8 glasses of water each day to prevent constipation.  Try not to strain when you have a bowel movement.  Your caregiver may recommend avoiding nuts and seeds to prevent complications, although this is still an uncertain benefit.  Only take over-the-counter or prescription medicines for pain, discomfort, or fever as directed by your caregiver. FOODS WITH HIGH FIBER CONTENT INCLUDE:  Fruits. Apple, peach, pear, tangerine, raisins, prunes.  Vegetables. Brussels sprouts, asparagus, broccoli, cabbage, carrot, cauliflower, romaine lettuce, spinach, summer squash, tomato, winter squash, zucchini.  Starchy Vegetables. Baked beans, kidney beans, lima beans, split peas, lentils, potatoes (with skin).  Grains. Whole wheat bread, brown rice, bran flake cereal, plain oatmeal, Zane Samson rice, shredded wheat, bran muffins. SEEK IMMEDIATE MEDICAL CARE IF:    You develop increasing pain or severe bloating.  You have an oral temperature above 102 F (38.9 C), not controlled by medicine.  You develop vomiting or bowel movements that are bloody or black. Document Released: 04/28/2004 Document Revised: 10/24/2011 Document Reviewed: 12/30/2009 [facility] Patient Information 2014 [place].    Colon Polyps Polyps are lumps of extra tissue growing inside the body. Polyps can grow in the large intestine (colon). Most colon polyps are noncancerous (benign). However, some colon polyps can become cancerous over time. Polyps that are larger than a pea may be harmful. To be safe, caregivers remove and test all polyps. CAUSES  Polyps form when mutations in the genes cause your cells to grow and divide even though no more tissue is needed. RISK FACTORS There are a number of risk factors that can increase your chances of getting colon polyps. They include:  Being older than 50 years.  Family history of colon polyps or colon cancer.  Long-term colon diseases, such as colitis or Crohn disease.  Being overweight.  Smoking.  Being inactive.  Drinking too much alcohol. SYMPTOMS  Most small polyps do not cause symptoms. If symptoms are present, they may include:  Blood in the stool. The stool may look dark red or black.  Constipation or diarrhea that lasts longer than 1 week. DIAGNOSIS People often do not know they have polyps until their caregiver finds them during a regular checkup. Your caregiver can use 4 tests to check for polyps:  Digital rectal exam. The caregiver wears gloves and feels inside the rectum. This test would find polyps only in the rectum.  Barium enema. The caregiver puts a liquid called barium into your rectum before taking X-rays of your colon. Barium makes your colon look Penney Domanski. Polyps are dark, so they are easy to see in the X-ray pictures.  Sigmoidoscopy. A thin, flexible tube (sigmoidoscope) is placed into your  rectum. The sigmoidoscope has a light and tiny camera in it. The caregiver uses the sigmoidoscope to look at the last third of your colon.  Colonoscopy. This test is like sigmoidoscopy, but the caregiver looks at the entire colon. This is the most common method for finding and removing polyps. TREATMENT  Any polyps will be removed during a sigmoidoscopy or colonoscopy. The polyps are then tested for cancer. PREVENTION  To help lower your risk of getting more colon polyps:  Eat plenty of fruits and vegetables. Avoid eating fatty foods.  Do not smoke.  Avoid drinking alcohol.  Exercise every day.  Lose weight if recommended by your caregiver.  Eat plenty of calcium and folate. Foods that are rich in calcium include milk, cheese, and broccoli. Foods that are rich in folate include chickpeas, kidney beans, and spinach. HOME CARE INSTRUCTIONS Keep all follow-up appointments as directed by your caregiver. You may need periodic exams to check for polyps. SEEK MEDICAL CARE IF: You notice bleeding during a bowel movement. Document Released: 04/27/2004 Document Revised: 10/24/2011 Document Reviewed: 10/11/2011 [facility] Patient Information 2014 [place].

## 2014-01-15 NOTE — H&P (View-Only) (Signed)
Primary Care Physician:  Gar Ponto, MD Primary Gastroenterologist:  Dr. Gala Romney  Chief Complaint  Patient presents with  . Colonoscopy    HPI:   Joanna Mendez presents today with a history of Alzheimer's dementia but otherwise fairly healthy, appearing younger than stated age. She presents due to rectal bleeding at the request of [place]. Patient is unsure if she has seen blood or not. Son present. Denies abdominal pain. Denies constipation or diarrhea. Son believes she may have had a colonoscopy many years ago. Patient denies this. Son states that the [place] denies any bleeding for a few weeks now. Taking Anusol suppositories prn. Appears incidence was low-volume hematochezia.    Past Medical History  Diagnosis Date  . Alzheimer disease   . Hypertension   . Anxiety   . Hyperlipidemia   . Osteoarthritis   . Allergic rhinitis   . Osteoporosis   . Anemia   . COPD (chronic obstructive pulmonary disease)     chronic bronchitis  . Chronic kidney disease     stage III  . Stroke 01/31/2013    TIA    Past Surgical History  Procedure Laterality Date  . Tonsillectomy    . Knee surgery    . Abdominal hysterectomy      Current Outpatient Prescriptions  Medication Sig Dispense Refill  . acetaminophen (TYLENOL) 500 MG tablet Take 500 mg by mouth 3 (three) times daily.      Marland Kitchen albuterol (PROVENTIL HFA;VENTOLIN HFA) 108 (90 BASE) MCG/ACT inhaler Inhale 2 puffs into the lungs every 4 (four) hours as needed for wheezing.      Marland Kitchen atorvastatin (LIPITOR) 10 MG tablet Take 5 mg by mouth daily.       . Calcium Citrate-Vitamin D (CITRACAL/VITAMIN D) 250-200 MG-UNIT TABS Take 1 tablet by mouth 3 (three) times daily.      . Cranberry 475 MG CAPS Take 475 mg by mouth 2 (two) times daily.      Marland Kitchen donepezil (ARICEPT) 10 MG tablet Take 10 mg by mouth at bedtime.      . ferrous sulfate 325 (65 FE) MG tablet Take 325 mg by mouth daily with breakfast.      . hydrocortisone  (ANUSOL-HC) 25 MG suppository Place 25 mg rectally 2 (two) times daily.      . Melatonin 5 MG CAPS Take 5 mg by mouth at bedtime.      . mirtazapine (REMERON) 15 MG tablet Take 7.5 mg by mouth at bedtime.       Marland Kitchen zinc oxide 20 % ointment Apply 1 application topically 3 (three) times daily as needed for irritation.       No current facility-administered medications for this visit.    Allergies as of 12/30/2013 - Review Complete 12/30/2013  Allergen Reaction Noted  . Penicillins  09/09/2013    Family History  Problem Relation Age of Onset  . Dementia Mother   . Dementia Maternal Grandmother   . Colon cancer Neg Hx     History   Social History  . Marital Status: Widowed    Spouse Name: N/A    Number of Children: N/A  . Years of Education: N/A   Occupational History  . Not on file.   Social History Main Topics  . Smoking status: Never Smoker   . Smokeless tobacco: Never Used  . Alcohol Use: No  . Drug Use: No  . Sexual Activity: Not Currently   Other Topics Concern  .  Not on file   Social History Narrative  . No narrative on file    Review of Systems: As mentioned in HPI  Physical Exam: BP 120/69  Pulse 72  Temp(Src) 97.3 F (36.3 C) (Oral)  Ht 5' (1.524 m)  Wt 112 lb 6.4 oz (50.984 kg)  BMI 21.95 kg/m2 General:   Alert and oriented to person only Head:  Normocephalic and atraumatic. Eyes:  Without icterus, sclera clear and conjunctiva pink.  Ears:  Normal auditory acuity. Nose:  No deformity, discharge,  or lesions. Mouth:  No deformity or lesions, oral mucosa pink.  Lungs:  Clear to auscultation bilaterally. No wheezes, rales, or rhonchi. No distress.  Heart:  S1, S2 present with question of soft systolic murmur Abdomen:  +BS, soft, non-tender and non-distended. No HSM noted. No guarding or rebound. No masses appreciated.  Rectal:  Non-thrombosed external hemorrhoids. Internal exam without mass or stricture, no gross blood appreciated.  Extremities:   Without edema. Neurologic:  Alert and  oriented to person only Skin:  Intact without significant lesions or rashes. Psych:  Alert and cooperative. Normal mood and affect.

## 2014-01-15 NOTE — Op Note (Signed)
[place], [address]   COLONOSCOPY PROCEDURE REPORT  PATIENT: Joanna Mendez, Joanna Mendez  MR#:         [id number] BIRTHDATE: [birth year] , 89  yrs. old GENDER: Female ENDOSCOPIST: R.  Garfield Cornea, MD FACP [facility] REFERRED BY:  Gar Ponto, M.D. PROCEDURE DATE:  01/15/2014 PROCEDURE:     Colonoscopy with rectal biopsy and snare polypectomy  INDICATIONS: hematochezia  INFORMED CONSENT:  The risks, benefits, alternatives and imponderables including but not limited to bleeding, perforation as well as the possibility of a missed lesion have been reviewed.  The potential for biopsy, lesion removal, etc. have also been discussed.  Questions have been answered.  All parties agreeable. Please see the history and physical in the medical record for more information.  MEDICATIONS: Versed 2 mg IV and Demerol 50 mg IV in divided doses. Zofran 4 mg IV  DESCRIPTION OF PROCEDURE:  After a digital rectal exam was performed, the [id number] ([id number])  colonoscope was advanced from the anus through the rectum and colon to the area of the cecum, ileocecal valve and appendiceal orifice.  The cecum was deeply intubated.  These structures were well-seen and photographed for the record.  From the level of the cecum and ileocecal valve, the scope was slowly and cautiously withdrawn.  The mucosal surfaces were carefully surveyed utilizing scope tip deflection to facilitate fold flattening as needed.  The scope was pulled down into the rectum where a thorough examination including retroflexion was performed.    FINDINGS:  Adequate preparation. The multiple benign-appearing rectal ulcers. The colon; otherwise, normal-appearing rectum. Sigmoid diverticula; (1) 8 mm polyp in the mid sigmoid segment; otherwise, the remainder of the colonic mucosa appeared normal.  THERAPEUTIC / DIAGNOSTIC MANEUVERS PERFORMED:  The above-mentioned polyp was hot snare removed. Area of rectal  ulceration biopsy  COMPLICATIONS: none  CECAL WITHDRAWAL TIME:  13 minutes  IMPRESSION:  Solitary rectal ulcer syndrome - status post rectal ulcer biopsy.  Colonic diverticulosis. Colonic polyp-removed as described above  RECOMMENDATIONS: Avoid constipation. Followup on pathology. Add Benefiber. Add Colace. Add  bedtime MiraLax as needed. One-month course of mesalamine suppositories -  1 g per rectum at  bedtime.   _______________________________ eSigned:  R. Garfield Cornea, MD FACP [facility] 01/15/2014 4:52 PM   CC:    PATIENT NAME:  [name], [name] MR#: [id number]

## 2014-01-16 ENCOUNTER — Encounter (HOSPITAL_COMMUNITY): Payer: Self-pay | Admitting: Internal Medicine

## 2014-01-18 ENCOUNTER — Encounter: Payer: Self-pay | Admitting: Internal Medicine

## 2014-02-10 ENCOUNTER — Other Ambulatory Visit: Payer: Self-pay | Admitting: Internal Medicine

## 2014-05-19 ENCOUNTER — Other Ambulatory Visit: Payer: Self-pay | Admitting: Gastroenterology

## 2017-02-16 ENCOUNTER — Emergency Department (HOSPITAL_COMMUNITY): Payer: Medicare Other

## 2017-02-16 ENCOUNTER — Encounter (HOSPITAL_COMMUNITY): Payer: Self-pay | Admitting: Emergency Medicine

## 2017-02-16 ENCOUNTER — Emergency Department (HOSPITAL_COMMUNITY)
Admission: EM | Admit: 2017-02-16 | Discharge: 2017-02-16 | Disposition: A | Discharge status: Home / Self Care | Payer: Medicare Other | Attending: Emergency Medicine | Admitting: Emergency Medicine

## 2017-02-16 DIAGNOSIS — Z79899 Other long term (current) drug therapy: Secondary | ICD-10-CM | POA: Insufficient documentation

## 2017-02-16 DIAGNOSIS — M25552 Pain in left hip: Secondary | ICD-10-CM | POA: Diagnosis not present

## 2017-02-16 DIAGNOSIS — Y92129 Unspecified place in nursing home as the place of occurrence of the external cause: Secondary | ICD-10-CM | POA: Insufficient documentation

## 2017-02-16 DIAGNOSIS — Y999 Unspecified external cause status: Secondary | ICD-10-CM | POA: Diagnosis not present

## 2017-02-16 DIAGNOSIS — M25551 Pain in right hip: Secondary | ICD-10-CM | POA: Diagnosis not present

## 2017-02-16 DIAGNOSIS — Y939 Activity, unspecified: Secondary | ICD-10-CM | POA: Insufficient documentation

## 2017-02-16 DIAGNOSIS — F0280 Dementia in other diseases classified elsewhere without behavioral disturbance: Secondary | ICD-10-CM | POA: Insufficient documentation

## 2017-02-16 DIAGNOSIS — M1990 Unspecified osteoarthritis, unspecified site: Secondary | ICD-10-CM | POA: Insufficient documentation

## 2017-02-16 DIAGNOSIS — W19XXXA Unspecified fall, initial encounter: Secondary | ICD-10-CM | POA: Insufficient documentation

## 2017-02-16 DIAGNOSIS — J449 Chronic obstructive pulmonary disease, unspecified: Secondary | ICD-10-CM | POA: Diagnosis not present

## 2017-02-16 DIAGNOSIS — N183 Chronic kidney disease, stage 3 (moderate): Secondary | ICD-10-CM | POA: Insufficient documentation

## 2017-02-16 DIAGNOSIS — I129 Hypertensive chronic kidney disease with stage 1 through stage 4 chronic kidney disease, or unspecified chronic kidney disease: Secondary | ICD-10-CM | POA: Insufficient documentation

## 2017-02-16 DIAGNOSIS — Z8673 Personal history of transient ischemic attack (TIA), and cerebral infarction without residual deficits: Secondary | ICD-10-CM | POA: Insufficient documentation

## 2017-02-16 DIAGNOSIS — M79642 Pain in left hand: Secondary | ICD-10-CM | POA: Diagnosis not present

## 2017-02-16 DIAGNOSIS — G309 Alzheimer's disease, unspecified: Secondary | ICD-10-CM | POA: Diagnosis not present

## 2017-02-16 NOTE — Discharge Instructions (Signed)
Workup for the fall without any acute injuries. CT head negative. X-ray of left hand without any bony injury. X-ray of both hips and pelvis without any bony injury. Patient seems to be comfortable no acute distress. Stable for discharge back to nursing facility.

## 2017-02-16 NOTE — ED Provider Notes (Signed)
[facility] DEPT Provider Note   CSN: [id number] Arrival date & time: 02/16/17  1839     History   Chief Complaint Chief Complaint  Patient presents with  . Fall    HPI Joanna Mendez is a 83 y.o. female.  Patient brought in from nursing facility. Patient has a history of dementia Alzheimer's disease. Patient apparently had a fall she is from [place] she was found on the floor. Patient was denying any complaints apparently the nursing facility but upon arrival here she was complaining of both hips hurting in her left hand hurting. No obvious trauma or injury she was alert and responsive.      Past Medical History:  Diagnosis Date  . Allergic rhinitis   . Alzheimer disease   . Anemia   . Anxiety   . Chronic kidney disease    stage III  . COPD (chronic obstructive pulmonary disease) (HCC)    chronic bronchitis  . Hyperlipidemia   . Hypertension   . Osteoarthritis   . Osteoporosis   . Stroke [facility]) 01/31/2013   TIA    Patient Active Problem List   Diagnosis Date Noted  . Rectal bleeding 12/30/2013  . Chest pain 11/19/2011  . Acute renal failure ([place]) 11/19/2011  . HTN (hypertension) 11/19/2011  . Hyperlipidemia 11/19/2011  . Dementia 11/19/2011    Past Surgical History:  Procedure Laterality Date  . ABDOMINAL HYSTERECTOMY    . COLONOSCOPY N/A 01/15/2014   Procedure: COLONOSCOPY;  Surgeon: Daneil Dolin, MD;  Location: [facility];  Service: Endoscopy;  Laterality: N/A;  8:30AM  . KNEE SURGERY    . TONSILLECTOMY      OB History    No data available       Home Medications    Prior to Admission medications   Medication Sig Start Date End Date Taking? Authorizing Provider  acetaminophen (TYLENOL) 500 MG tablet Take 500 mg by mouth 3 (three) times daily.    [provider]  albuterol (PROVENTIL HFA;VENTOLIN HFA) 108 (90 BASE) MCG/ACT inhaler Inhale 2 puffs into the lungs every 4 (four) hours as needed for wheezing.    [provider]  atorvastatin (LIPITOR) 10 MG tablet Take 5 mg by mouth daily.     [provider]  Calcium Citrate-Vitamin D (CITRACAL/VITAMIN D) 250-200 MG-UNIT TABS Take 1 tablet by mouth 3 (three) times daily.    [provider]  Cranberry 475 MG CAPS Take 475 mg by mouth 2 (two) times daily.    [provider]  donepezil (ARICEPT) 10 MG tablet Take 10 mg by mouth at bedtime.    [provider]  ferrous sulfate 325 (65 FE) MG tablet Take 325 mg by mouth daily with breakfast.    [provider]  hydrocortisone (ANUSOL-HC) 2.5 % rectal cream Place 1 application rectally 2 (two) times daily. For 7 days 12/30/13   Annitta Needs, NP  hydrocortisone (ANUSOL-HC) 25 MG suppository Place 25 mg rectally 2 (two) times daily.    [provider]  Melatonin 5 MG CAPS Take 5 mg by mouth at bedtime.    [provider]  mirtazapine (REMERON) 15 MG tablet Take 7.5 mg by mouth at bedtime.     [provider]  polyethylene glycol-electrolytes (TRILYTE) 420 G solution Take 4,000 mLs by mouth as directed. 12/30/13   Rourk, Cristopher Estimable, MD  STOOL SOFTENER 100 MG capsule TAKE 1 CAPSULE BY MOUTH TWICE DAILY. 05/21/14   Mahala Menghini, PA-C  zinc oxide 20 % ointment Apply 1 application topically 3 (three) times daily as needed for irritation.    [provider]    Family History Family History  Problem Relation Age of Onset  . Dementia Mother   . Dementia Maternal Grandmother   . Colon cancer Neg Hx     Social History Social History  Substance Use Topics  . Smoking status: Never Smoker  . Smokeless tobacco: Never Used  . Alcohol use No     Allergies   Penicillins   Review of Systems Review of Systems  Unable to perform ROS: Dementia     Physical Exam Updated Vital Signs BP (!) 141/70 (BP Location: Left Arm)   Pulse 73   Temp 98.3 F (36.8 C) (Oral)   Resp 16   Ht 1.626 m (5\' 4" )   Wt 61.2 kg (135 lb)   SpO2 100%   BMI 23.17  kg/m   Physical Exam  Constitutional: She appears well-developed and well-nourished. No distress.  HENT:  Head: Normocephalic and atraumatic.  Mouth/Throat: Oropharynx is clear and moist.  Eyes: Conjunctivae and EOM are normal. Pupils are equal, round, and reactive to light.  Neck: Neck supple.  No posterior tenderness to the back of the neck. Patient moving her head around freely.  Cardiovascular: Normal rate and regular rhythm.   Pulmonary/Chest: Effort normal and breath sounds normal.  Abdominal: Soft. Bowel sounds are normal. There is no tenderness.  Musculoskeletal: Normal range of motion. She exhibits no edema.  Patient with tenderness to palpation to the left hand around the ring finger. No obvious deformity. Also tenderness to palpation to both hips. No obvious deformity. Patient moving legs fine.  Neurological: She is alert. No cranial nerve deficit or sensory deficit. She exhibits normal muscle tone. Coordination normal.  Skin: Skin is warm.  Nursing note and vitals reviewed.    ED Treatments / Results  Labs (all labs ordered are listed, but only abnormal results are displayed) Labs Reviewed - No data to display  EKG  EKG Interpretation None       Radiology Ct Head Wo Contrast  Result Date: 02/16/2017 CLINICAL DATA:  Fall at nursing home, found on floor by staff. EXAM: CT HEAD WITHOUT CONTRAST TECHNIQUE: Contiguous axial images were obtained from the base of the skull through the vertex without intravenous contrast. COMPARISON:  Head CT 05/12/2013 FINDINGS: Brain: No evidence of acute infarction, hemorrhage, hydrocephalus, extra-axial collection or mass lesion/mass effect. Stable degree of atrophy and chronic small vessel ischemia from prior exam. Remote lacunar infarcts in the bilateral cerebellum, left greater than right. Vascular: Atherosclerosis of skullbase vasculature without hyperdense vessel or abnormal calcification. Skull: No skull fracture. No suspicious focal  lesion. Arachnoid granulation noted in the posterior occiput, unchanged. Sinuses/Orbits: Motion limited evaluation. No evidence of acute abnormality. Other: None. IMPRESSION: 1.  No acute intracranial abnormality.  No skull fracture. 2. Stable atrophy, remote cerebellar infarcts, and chronic small vessel ischemia from prior exam. Electronically Signed   By: Jeb Levering M.D.   On: 02/16/2017 20:05   Dg Hand Complete Left  Result Date: 02/16/2017 CLINICAL DATA:  Fall with left ring finger pain. Unwitnessed fall at nursing home today. EXAM: LEFT HAND - COMPLETE 3+ VIEW COMPARISON:  None. FINDINGS: There is no evidence of fracture or dislocation. Widening of the scapholunate interval is likely chronic. Multifocal osteoarthritis, mild for age. Bones are under mineralized. No focal soft tissue abnormality. IMPRESSION: 1. No acute fracture or dislocation. 2. Scapholunate interval  widening, likely chronic. Mild osteoarthritis. 3. Bony under mineralization with osteopenia/osteoporosis. Electronically Signed   By: Jeb Levering M.D.   On: 02/16/2017 20:31   Dg Hips Bilat W Or Wo Pelvis 3-4 Views  Result Date: 02/16/2017 CLINICAL DATA:  Unwitnessed fall. EXAM: DG HIP (WITH OR WITHOUT PELVIS) 3-4V BILAT COMPARISON:  None. FINDINGS: Degenerative changes noted lower lumbar spine. SI joints and symphysis pubis are unremarkable. No evidence for pubic ramus fracture. Joint space in the hips is preserved and symmetric. AP and frog-leg lateral views of the right hip show no evidence for femoral neck fracture. AP and frog-leg lateral views of the left hip are without evidence for femoral neck fracture. IMPRESSION: No acute bony abnormality. Electronically Signed   By: Misty Stanley M.D.   On: 02/16/2017 20:30    Procedures Procedures (including critical care time)  Medications Ordered in ED Medications - No data to display   Initial Impression / Assessment and Plan / ED Course  I have reviewed the triage vital  signs and the nursing notes.  Pertinent labs & imaging results that were available during my care of the patient were reviewed by me and considered in my medical decision making (see chart for details).     Patient brought in from nursing facility for fall. Workup without any obvious injuries CT head negative x-rays of pelvis and both hips negative. X-ray of left hand without any bony injury. Patient now seems to be very comfortable sitting up using her extremities. She does have a baseline history of dementia. Family member present. They are unable to notice any significant change in her any concerns for injury. Patient stable for discharge back to nursing facility.  Final Clinical Impressions(s) / ED Diagnoses   Final diagnoses:  Fall, initial encounter    New Prescriptions New Prescriptions   No medications on file     Fredia Sorrow, MD 02/16/17 2154

## 2017-02-16 NOTE — ED Triage Notes (Signed)
Pt fell at [facility].  Found in floor by staff.  Denies complaints.

## 2017-05-04 ENCOUNTER — Ambulatory Visit: Payer: Medicare Other | Admitting: Orthopaedic Surgery

## 2017-05-09 ENCOUNTER — Ambulatory Visit (INDEPENDENT_AMBULATORY_CARE_PROVIDER_SITE_OTHER): Payer: Medicare Other | Admitting: Orthopaedic Surgery

## 2017-05-09 ENCOUNTER — Ambulatory Visit: Payer: Medicare Other | Admitting: Orthopaedic Surgery

## 2017-05-09 ENCOUNTER — Encounter: Payer: Self-pay | Admitting: Orthopaedic Surgery

## 2017-05-09 VITALS — Temp 97.8°F | Ht <= 58 in | Wt 127.6 lb

## 2017-05-09 DIAGNOSIS — S92355A Nondisplaced fracture of fifth metatarsal bone, left foot, initial encounter for closed fracture: Secondary | ICD-10-CM

## 2017-05-09 NOTE — Progress Notes (Signed)
Subjective:    Patient ID: Joanna Mendez, female    DOB: Mar 06, 1924, 83 y.o.   MRN: [id number]  HPI She is resident at [facility] here in [place]. She is chronically confused but very pleasant.  She knows I am a doctor but does not know place or time.  She hurt her left foot and X-rays were done at the nursing home.  I have reviewed the report and CD.  She has a spiral nondisplaced fracture of the left fifth metatarsal.  She uses a walker normally.  She is in a regular shoe.  She has no pain.   Review of Systems  HENT: Negative for congestion.   Respiratory: Negative for cough and shortness of breath.   Cardiovascular: Negative for chest pain and leg swelling.  Endocrine: Positive for cold intolerance.  Musculoskeletal: Positive for arthralgias and gait problem.  Allergic/Immunologic: Positive for environmental allergies.  Neurological:       Chronically confused.    Past Medical History:  Diagnosis Date  . Allergic rhinitis   . Alzheimer disease   . Anemia   . Anxiety   . Chronic kidney disease    stage III  . COPD (chronic obstructive pulmonary disease) (HCC)    chronic bronchitis  . Hyperlipidemia   . Hypertension   . Osteoarthritis   . Osteoporosis   . Stroke ([place]) 01/31/2013   TIA    Past Surgical History:  Procedure Laterality Date  . ABDOMINAL HYSTERECTOMY    . COLONOSCOPY N/A 01/15/2014   Procedure: COLONOSCOPY;  Surgeon: Daneil Dolin, MD;  Location: [facility];  Service: Endoscopy;  Laterality: N/A;  8:30AM  . KNEE SURGERY    . TONSILLECTOMY      Current Outpatient Prescriptions on File Prior to Visit  Medication Sig Dispense Refill  . acetaminophen (TYLENOL) 500 MG tablet Take 500 mg by mouth 3 (three) times daily.    Marland Kitchen albuterol (PROVENTIL HFA;VENTOLIN HFA) 108 (90 BASE) MCG/ACT inhaler Inhale 2 puffs into the lungs every 4 (four) hours as needed for wheezing.    Marland Kitchen atorvastatin (LIPITOR) 10 MG tablet Take 5 mg by mouth daily.     .  Calcium Citrate-Vitamin D (CITRACAL/VITAMIN D) 250-200 MG-UNIT TABS Take 1 tablet by mouth 3 (three) times daily.    . Cranberry 475 MG CAPS Take 475 mg by mouth 2 (two) times daily.    Marland Kitchen donepezil (ARICEPT) 10 MG tablet Take 10 mg by mouth at bedtime.    . ferrous sulfate 325 (65 FE) MG tablet Take 325 mg by mouth daily with breakfast.    . hydrocortisone (ANUSOL-HC) 2.5 % rectal cream Place 1 application rectally 2 (two) times daily. For 7 days 30 g 1  . hydrocortisone (ANUSOL-HC) 25 MG suppository Place 25 mg rectally 2 (two) times daily.    . Melatonin 5 MG CAPS Take 5 mg by mouth at bedtime.    . mirtazapine (REMERON) 15 MG tablet Take 7.5 mg by mouth at bedtime.     . polyethylene glycol-electrolytes (TRILYTE) 420 G solution Take 4,000 mLs by mouth as directed. 4000 mL 0  . STOOL SOFTENER 100 MG capsule TAKE 1 CAPSULE BY MOUTH TWICE DAILY. 60 capsule 11  . zinc oxide 20 % ointment Apply 1 application topically 3 (three) times daily as needed for irritation.     No current facility-administered medications on file prior to visit.     Social History   Social History  . Marital status: Widowed  Spouse name: N/A  . Number of children: N/A  . Years of education: N/A   Occupational History  . Not on file.   Social History Main Topics  . Smoking status: Never Smoker  . Smokeless tobacco: Never Used  . Alcohol use No  . Drug use: No  . Sexual activity: Not Currently   Other Topics Concern  . Not on file   Social History Narrative  . No narrative on file    Family History  Problem Relation Age of Onset  . Dementia Mother   . Dementia Maternal Grandmother   . Colon cancer Neg Hx     Temp 97.8 F (36.6 C)   Ht 4\' 10"  (1.473 m)   Wt 127 lb 9.6 oz (57.9 kg)   BMI 26.67 kg/m       Objective:   Physical Exam  Constitutional: She is oriented to person, place, and time. She appears well-developed and well-nourished.  HENT:  Head: Normocephalic and atraumatic.    Eyes: Pupils are equal, round, and reactive to light. Conjunctivae and EOM are normal.  Neck: Normal range of motion. Neck supple.  Cardiovascular: Normal rate, regular rhythm and intact distal pulses.   Pulmonary/Chest: Effort normal.  Abdominal: Soft.  Musculoskeletal: Normal range of motion.  She has no pain, no swelling, no redness of the left foot with normal gait with walker.  Neurological: She is alert and oriented to person, place, and time. She displays normal reflexes. No cranial nerve deficit. She exhibits normal muscle tone. Coordination normal.  Skin: Skin is warm and dry.  Psychiatric: She has a normal mood and affect. Her behavior is normal.  Disoriented to place, time.  Thinks it is 1980.  Does not know who the President is.          Assessment & Plan:   Encounter Diagnosis  Name Primary?  . Closed nondisplaced fracture of fifth metatarsal bone of left foot, initial encounter Yes   She has no pain.  She is walking with walker well.  I will not change to post op shoe as that will confuse her more.  She is doing well with a regular shoe and I will continue this.  Return in three weeks.  X-rays prior at the nursing home, or if not, then here on return.  Call if any problem.  Precautions discussed.    Electronically Signed Sanjuana Kava, MD 9/25/20189:20 AM

## 2017-05-30 ENCOUNTER — Ambulatory Visit: Payer: Medicare Other | Admitting: Orthopaedic Surgery

## 2017-06-08 ENCOUNTER — Ambulatory Visit: Payer: Medicare Other | Admitting: Orthopaedic Surgery

## 2017-06-13 ENCOUNTER — Ambulatory Visit (INDEPENDENT_AMBULATORY_CARE_PROVIDER_SITE_OTHER): Payer: Self-pay | Admitting: Orthopaedic Surgery

## 2017-06-13 ENCOUNTER — Ambulatory Visit (INDEPENDENT_AMBULATORY_CARE_PROVIDER_SITE_OTHER): Payer: Medicare Other

## 2017-06-13 ENCOUNTER — Encounter: Payer: Self-pay | Admitting: Orthopaedic Surgery

## 2017-06-13 VITALS — BP 130/76 | HR 78 | Temp 97.0°F | Ht <= 58 in | Wt 128.0 lb

## 2017-06-13 DIAGNOSIS — S92355D Nondisplaced fracture of fifth metatarsal bone, left foot, subsequent encounter for fracture with routine healing: Secondary | ICD-10-CM | POA: Diagnosis not present

## 2017-06-13 NOTE — Progress Notes (Signed)
CC:  My foot does not hurt  She has no pain of the left foot.  NV intact.  She uses walker.  X-rays were done of the left foot, reported separately.  Encounter Diagnosis  Name Primary?  . Closed nondisplaced fracture of fifth metatarsal bone of left foot with routine healing, subsequent encounter Yes   Return in one month, X-rays then.  Forms completed for rest home.  Call if any problem.  Precautions discussed.   Electronically Signed Sanjuana Kava, MD 10/30/20181:49 PM

## 2017-07-13 ENCOUNTER — Ambulatory Visit: Payer: Medicare Other | Admitting: Orthopaedic Surgery

## 2017-11-09 ENCOUNTER — Ambulatory Visit (INDEPENDENT_AMBULATORY_CARE_PROVIDER_SITE_OTHER): Payer: Medicare Other | Admitting: Otolaryngology

## 2017-11-09 DIAGNOSIS — H6123 Impacted cerumen, bilateral: Secondary | ICD-10-CM | POA: Diagnosis not present

## 2017-11-09 DIAGNOSIS — H903 Sensorineural hearing loss, bilateral: Secondary | ICD-10-CM

## 2018-03-21 ENCOUNTER — Inpatient Hospital Stay (HOSPITAL_COMMUNITY)
Admission: EM | Admit: 2018-03-21 | Discharge: 2018-03-23 | DRG: 379 | Disposition: A | Discharge status: Skilled Nursing Facility | Payer: Medicare Other | Source: Skilled Nursing Facility | Attending: Internal Medicine | Admitting: Internal Medicine

## 2018-03-21 ENCOUNTER — Other Ambulatory Visit: Payer: Self-pay

## 2018-03-21 ENCOUNTER — Emergency Department (HOSPITAL_COMMUNITY): Payer: Medicare Other

## 2018-03-21 ENCOUNTER — Encounter (HOSPITAL_COMMUNITY): Payer: Self-pay

## 2018-03-21 DIAGNOSIS — F0280 Dementia in other diseases classified elsewhere without behavioral disturbance: Secondary | ICD-10-CM | POA: Diagnosis present

## 2018-03-21 DIAGNOSIS — R55 Syncope and collapse: Secondary | ICD-10-CM | POA: Diagnosis present

## 2018-03-21 DIAGNOSIS — M79606 Pain in leg, unspecified: Secondary | ICD-10-CM

## 2018-03-21 DIAGNOSIS — R195 Other fecal abnormalities: Secondary | ICD-10-CM | POA: Diagnosis present

## 2018-03-21 DIAGNOSIS — Z8719 Personal history of other diseases of the digestive system: Secondary | ICD-10-CM | POA: Diagnosis not present

## 2018-03-21 DIAGNOSIS — E860 Dehydration: Secondary | ICD-10-CM | POA: Diagnosis present

## 2018-03-21 DIAGNOSIS — G309 Alzheimer's disease, unspecified: Secondary | ICD-10-CM | POA: Diagnosis present

## 2018-03-21 DIAGNOSIS — Z8673 Personal history of transient ischemic attack (TIA), and cerebral infarction without residual deficits: Secondary | ICD-10-CM | POA: Diagnosis not present

## 2018-03-21 DIAGNOSIS — F419 Anxiety disorder, unspecified: Secondary | ICD-10-CM | POA: Diagnosis not present

## 2018-03-21 DIAGNOSIS — N183 Chronic kidney disease, stage 3 unspecified: Secondary | ICD-10-CM | POA: Diagnosis present

## 2018-03-21 DIAGNOSIS — D500 Iron deficiency anemia secondary to blood loss (chronic): Secondary | ICD-10-CM | POA: Diagnosis present

## 2018-03-21 DIAGNOSIS — I1310 Hypertensive heart and chronic kidney disease without heart failure, with stage 1 through stage 4 chronic kidney disease, or unspecified chronic kidney disease: Secondary | ICD-10-CM | POA: Diagnosis present

## 2018-03-21 DIAGNOSIS — K922 Gastrointestinal hemorrhage, unspecified: Principal | ICD-10-CM | POA: Diagnosis present

## 2018-03-21 DIAGNOSIS — Z66 Do not resuscitate: Secondary | ICD-10-CM | POA: Diagnosis present

## 2018-03-21 DIAGNOSIS — Z880 Allergy status to penicillin: Secondary | ICD-10-CM | POA: Diagnosis not present

## 2018-03-21 DIAGNOSIS — F411 Generalized anxiety disorder: Secondary | ICD-10-CM | POA: Diagnosis present

## 2018-03-21 DIAGNOSIS — E785 Hyperlipidemia, unspecified: Secondary | ICD-10-CM | POA: Diagnosis present

## 2018-03-21 DIAGNOSIS — M810 Age-related osteoporosis without current pathological fracture: Secondary | ICD-10-CM | POA: Diagnosis present

## 2018-03-21 DIAGNOSIS — J449 Chronic obstructive pulmonary disease, unspecified: Secondary | ICD-10-CM | POA: Diagnosis present

## 2018-03-21 DIAGNOSIS — F0281 Dementia in other diseases classified elsewhere with behavioral disturbance: Secondary | ICD-10-CM | POA: Diagnosis not present

## 2018-03-21 DIAGNOSIS — R531 Weakness: Secondary | ICD-10-CM | POA: Diagnosis not present

## 2018-03-21 DIAGNOSIS — D649 Anemia, unspecified: Secondary | ICD-10-CM | POA: Diagnosis not present

## 2018-03-21 DIAGNOSIS — Z79890 Hormone replacement therapy (postmenopausal): Secondary | ICD-10-CM | POA: Diagnosis not present

## 2018-03-21 HISTORY — DX: Vitamin D deficiency, unspecified: E55.9

## 2018-03-21 HISTORY — DX: Anorexia: R63.0

## 2018-03-21 LAB — BASIC METABOLIC PANEL
ANION GAP: 11 (ref 5–15)
BUN: 41 mg/dL — AB (ref 8–23)
CO2: 18 mmol/L — ABNORMAL LOW (ref 22–32)
Calcium: 8.9 mg/dL (ref 8.9–10.3)
Chloride: 111 mmol/L (ref 98–111)
Creatinine, Ser: 1.42 mg/dL — ABNORMAL HIGH (ref 0.44–1.00)
GFR calc Af Amer: 36 mL/min — ABNORMAL LOW (ref >60)
GFR calc non Af Amer: 31 mL/min — ABNORMAL LOW (ref >60)
Glucose, Bld: 77 mg/dL (ref 70–99)
POTASSIUM: 4.6 mmol/L (ref 3.5–5.1)
Sodium: 140 mmol/L (ref 135–145)

## 2018-03-21 LAB — CBC WITH DIFFERENTIAL/PLATELET
Basophils Absolute: 0 10*3/uL (ref 0.0–0.1)
Basophils Relative: 1 %
EOS PCT: 4 %
Eosinophils Absolute: 0.2 10*3/uL (ref 0.0–0.7)
HCT: 29.9 % — ABNORMAL LOW (ref 36.0–46.0)
Hemoglobin: 8.7 g/dL — ABNORMAL LOW (ref 12.0–15.0)
LYMPHS PCT: 34 %
Lymphs Abs: 1.9 10*3/uL (ref 0.7–4.0)
MCH: 25.7 pg — ABNORMAL LOW (ref 26.0–34.0)
MCHC: 29.1 g/dL — ABNORMAL LOW (ref 30.0–36.0)
MCV: 88.5 fL (ref 78.0–100.0)
Monocytes Absolute: 0.6 10*3/uL (ref 0.1–1.0)
Monocytes Relative: 10 %
Neutro Abs: 3 10*3/uL (ref 1.7–7.7)
Neutrophils Relative %: 51 %
Platelets: 338 10*3/uL (ref 150–400)
RBC: 3.38 MIL/uL — AB (ref 3.87–5.11)
RDW: 17.8 % — ABNORMAL HIGH (ref 11.5–15.5)
WBC: 5.7 10*3/uL (ref 4.0–10.5)

## 2018-03-21 LAB — POC OCCULT BLOOD, ED: FECAL OCCULT BLD: POSITIVE — AB

## 2018-03-21 MED ORDER — SODIUM CHLORIDE 0.9% FLUSH
3.0000 mL | Freq: Two times a day (BID) | INTRAVENOUS | Status: DC
  Administered 2018-03-22 – 2018-03-23 (×3): 3 mL via INTRAVENOUS

## 2018-03-21 MED ORDER — HALOPERIDOL LACTATE 5 MG/ML IJ SOLN: 1.0000 mg | Freq: Four times a day (QID) | INTRAMUSCULAR | Status: DC | PRN

## 2018-03-21 MED ORDER — GUAIFENESIN ER 600 MG PO TB12: 600.0000 mg | ORAL_TABLET | Freq: Two times a day (BID) | ORAL | Status: DC | PRN

## 2018-03-21 MED ORDER — DOCUSATE SODIUM 100 MG PO CAPS
100.0000 mg | ORAL_CAPSULE | Freq: Two times a day (BID) | ORAL | Status: DC
  Administered 2018-03-22 – 2018-03-23 (×4): 100 mg via ORAL
  Filled 2018-03-21 (×4): qty 1

## 2018-03-21 MED ORDER — POLYETHYLENE GLYCOL 3350 17 GM/SCOOP PO POWD
17.0000 g | Freq: Every day | ORAL | Status: DC | PRN
  Filled 2018-03-21: qty 255

## 2018-03-21 MED ORDER — ALPRAZOLAM 0.5 MG PO TABS
0.2500 mg | ORAL_TABLET | Freq: Two times a day (BID) | ORAL | Status: DC
Start: 2018-03-21 — End: 2018-03-23
  Administered 2018-03-22 – 2018-03-23 (×4): 0.25 mg via ORAL
  Filled 2018-03-21 (×4): qty 1

## 2018-03-21 MED ORDER — ACETAMINOPHEN 650 MG RE SUPP: 650.0000 mg | Freq: Four times a day (QID) | RECTAL | Status: DC | PRN

## 2018-03-21 MED ORDER — MELATONIN 5 MG PO TABS
5.0000 mg | ORAL_TABLET | Freq: Every day | ORAL | Status: DC
  Filled 2018-03-21 (×2): qty 1

## 2018-03-21 MED ORDER — ACETAMINOPHEN 325 MG PO TABS: 650.0000 mg | ORAL_TABLET | Freq: Four times a day (QID) | ORAL | Status: DC | PRN

## 2018-03-21 MED ORDER — PANTOPRAZOLE SODIUM 40 MG IV SOLR
40.0000 mg | Freq: Two times a day (BID) | INTRAVENOUS | Status: DC
  Administered 2018-03-22: 40 mg via INTRAVENOUS
  Filled 2018-03-21 (×2): qty 40

## 2018-03-21 MED ORDER — ONDANSETRON HCL 4 MG PO TABS: 4.0000 mg | ORAL_TABLET | Freq: Four times a day (QID) | ORAL | Status: DC | PRN

## 2018-03-21 MED ORDER — HYDROCODONE-ACETAMINOPHEN 5-325 MG PO TABS: 1.0000 | ORAL_TABLET | ORAL | Status: DC | PRN

## 2018-03-21 MED ORDER — CALCIUM CARBONATE-VITAMIN D 500-200 MG-UNIT PO TABS
ORAL_TABLET | Freq: Three times a day (TID) | ORAL | Status: DC
  Administered 2018-03-22 – 2018-03-23 (×3): 1 via ORAL
  Filled 2018-03-21 (×5): qty 1

## 2018-03-21 MED ORDER — IPRATROPIUM-ALBUTEROL 0.5-2.5 (3) MG/3ML IN SOLN: 3.0000 mL | Freq: Four times a day (QID) | RESPIRATORY_TRACT | Status: DC | PRN

## 2018-03-21 MED ORDER — DEXTROSE-NACL 5-0.45 % IV SOLN
INTRAVENOUS | Status: DC
  Administered 2018-03-22: 01:00:00 via INTRAVENOUS

## 2018-03-21 MED ORDER — MIRTAZAPINE 15 MG PO TABS
7.5000 mg | ORAL_TABLET | Freq: Every day | ORAL | Status: DC
  Administered 2018-03-22 (×2): 7.5 mg via ORAL
  Filled 2018-03-21 (×2): qty 1

## 2018-03-21 MED ORDER — ONDANSETRON HCL 4 MG/2ML IJ SOLN: 4.0000 mg | Freq: Four times a day (QID) | INTRAMUSCULAR | Status: DC | PRN

## 2018-03-21 NOTE — ED Notes (Signed)
Patient will not keep her blood pressure cuff on and became very agitated. Patient has also tried to get out of the bed multiple times saying she has to go to the bathroom. Patient has been assisted x 2 to the rest room with no urine output.

## 2018-03-21 NOTE — ED Notes (Signed)
Pt placed on 2L o2 via [place].

## 2018-03-21 NOTE — H&P (Signed)
History and Physical    Joanna Mendez MEQ:[id number] DOB: [birth year] DOA: 03/21/2018  PCP: Caryl Bis, MD   Patient coming from: SNF   Chief Complaint: Fall (caught), lightheaded   HPI: Joanna Mendez is a 84 y.o. female with medical history significant for dementia, anxiety, COPD, and chronic kidney disease stage III, now presenting to the emergency department from her SNF after a fall.  Patient is unable to contribute much to the history secondary to her history of dementia and some agitation in the ED.  Per report of SNF personnel, she was ambulating with her walker when she began to fall and was caught by a bystander.  She may have reported some lightheadedness at the time and complained of hip pain initially.  She did not actually strike the ground or hit her head and there was no loss of consciousness.  She had not been complaining of anything leading up to this and there was no vomiting or diarrhea documented recently.  She had a colonoscopy in 2015 with rectal ulcer that was biopsied (benign path), colonic diverticulosis, and colonic polyp that was resected (benign path).  ED Course: Upon arrival to the ED, patient is found to be afebrile, saturating 88% on room air, and with vitals otherwise normal.  EKG features a sinus rhythm and chest x-ray is negative for acute cardiopulmonary disease.  There is no acute findings on lumbar spine and pelvic radiographs.  Chemistry panel is notable for a bicarbonate of 18, BUN 41, and creatinine of 1.42, similar to priors.  CBC features a normocytic anemia with hemoglobin of 8.7, down from 13.4 in 2015.  Fecal occult blood testing was positive.  Type and screen was performed and gastroenterology was consulted by the ED physician, recommending a medical admission with clear liquids okay, indicated that they would plan to evaluate the patient in the morning.  Review of Systems:  Unable to complete ROS secondary to patient's clinical condition.  Past  Medical History:  Diagnosis Date  . Allergic rhinitis   . Alzheimer disease   . Anemia   . Anorexia   . Anxiety   . Chronic kidney disease    stage III  . COPD (chronic obstructive pulmonary disease) (HCC)    chronic bronchitis  . Hyperlipidemia   . Hypertension   . Osteoarthritis   . Osteoporosis   . Stroke ([place]) 01/31/2013   TIA  . Vitamin D deficiency     Past Surgical History:  Procedure Laterality Date  . ABDOMINAL HYSTERECTOMY    . COLONOSCOPY N/A 01/15/2014   Procedure: COLONOSCOPY;  Surgeon: Daneil Dolin, MD;  [facility]: [facility];  Service: Endoscopy;  Laterality: N/A;  8:30AM  . KNEE SURGERY    . TONSILLECTOMY       reports that she has never smoked. She has never used smokeless tobacco. She reports that she does not drink alcohol or use drugs.  Allergies  Allergen Reactions  . Penicillins     Unknown-not listed on current MAR    Family History  Problem Relation Age of Onset  . Dementia Mother   . Dementia Maternal Grandmother   . Colon cancer Neg Hx      Prior to Admission medications   Medication Sig Start Date End Date Taking? Authorizing Provider  acetaminophen (TYLENOL) 500 MG tablet Take 500 mg by mouth 3 (three) times daily.   Yes [provider]  ALPRAZolam (XANAX) 0.25 MG tablet Take 0.25 mg by mouth 2 (two)  times daily.   Yes [provider]  Calcium Citrate-Vitamin D (CITRACAL/VITAMIN D) 250-200 MG-UNIT TABS Take 1 tablet by mouth 3 (three) times daily.   Yes [provider]  Cholecalciferol (VITAMIN D) 2000 units CAPS Take 1 capsule by mouth daily.   Yes [provider]  Cranberry 425 MG CAPS Take 425 mg by mouth 2 (two) times daily.    Yes [provider]  guaiFENesin (MUCINEX) 600 MG 12 hr tablet Take 600 mg by mouth every 12 (twelve) hours as needed (congestion).   Yes [provider]  loratadine (CLARITIN) 10 MG tablet Take 10 mg by mouth daily.   Yes [provider]    Melatonin 5 MG CAPS Take 5 mg by mouth at bedtime.   Yes [provider]  mirtazapine (REMERON) 15 MG tablet Take 7.5 mg by mouth at bedtime.    Yes [provider]  mupirocin ointment (BACTROBAN) 2 % Apply 1 application topically 2 (two) times daily. Swelling-infection (knee)   Yes [provider]  polyethylene glycol powder (GLYCOLAX/MIRALAX) powder Take 17 g by mouth daily as needed for mild constipation.   Yes [provider]  STOOL SOFTENER 100 MG capsule TAKE 1 CAPSULE BY MOUTH TWICE DAILY. Patient taking differently: TAKE 1 CAPSULE BY MOUTH DAILY. 05/21/14  Yes Mahala Menghini, PA-C  triamcinolone cream (KENALOG) 0.1 % Apply 1 application topically daily as needed (for affected area(s) Left knee).   Yes [provider]    Physical Exam: Vitals:   03/21/18 2145 03/21/18 2200 03/21/18 2230 03/21/18 2232  BP:      Pulse: 69     Resp: 17 19 14 17   Temp:      TempSrc:      SpO2: 99%     Weight:          Constitutional: NAD, agitated Eyes: PERTLA, lids and conjunctivae normal ENMT: Mucous membranes are moist. Posterior pharynx clear of any exudate or lesions.   Neck: normal, supple, no masses, no thyromegaly Respiratory: Expiratory wheezes, no crackles. Normal respiratory effort.  Cardiovascular: S1 & S2 heard, regular rate and rhythm. No extremity edema.   Abdomen: No distension, no tenderness, soft. Bowel sounds active.  Musculoskeletal: no clubbing / cyanosis. No joint deformity upper and lower extremities.    Skin: no significant rashes, lesions, ulcers. Warm, dry, well-perfused. Neurologic: No gross facial asymmetry. Gross hearing deficit. Sensation intact. Moving all extremities.  Psychiatric: Alert. Agitated.      Labs on Admission: I have personally reviewed following labs and imaging studies  CBC: Recent Labs  Lab 03/21/18 1941  WBC 5.7  NEUTROABS 3.0  HGB 8.7*  HCT 29.9*  MCV 88.5  PLT 244   Basic Metabolic  Panel: Recent Labs  Lab 03/21/18 1941  NA 140  K 4.6  CL 111  CO2 18*  GLUCOSE 77  BUN 41*  CREATININE 1.42*  CALCIUM 8.9   GFR: CrCl cannot be calculated (Unknown ideal weight.). Liver Function Tests: No results for input(s): AST, ALT, ALKPHOS, BILITOT, PROT, ALBUMIN in the last 168 hours. No results for input(s): LIPASE, AMYLASE in the last 168 hours. No results for input(s): AMMONIA in the last 168 hours. Coagulation Profile: No results for input(s): INR, PROTIME in the last 168 hours. Cardiac Enzymes: No results for input(s): CKTOTAL, CKMB, CKMBINDEX, TROPONINI in the last 168 hours. BNP (last 3 results) No results for input(s): PROBNP in the last 8760 hours. HbA1C: No results for input(s): HGBA1C in the  last 72 hours. CBG: No results for input(s): GLUCAP in the last 168 hours. Lipid Profile: No results for input(s): CHOL, HDL, LDLCALC, TRIG, CHOLHDL, LDLDIRECT in the last 72 hours. Thyroid Function Tests: No results for input(s): TSH, T4TOTAL, FREET4, T3FREE, THYROIDAB in the last 72 hours. Anemia Panel: No results for input(s): VITAMINB12, FOLATE, FERRITIN, TIBC, IRON, RETICCTPCT in the last 72 hours. Urine analysis:    Component Value Date/Time   COLORURINE YELLOW 07/10/2013 [place] 07/10/2013 1157   LABSPEC 1.020 07/10/2013 1157   PHURINE 6.0 07/10/2013 1157   GLUCOSEU NEGATIVE 07/10/2013 1157   HGBUR TRACE (A) 07/10/2013 1157   BILIRUBINUR NEGATIVE 07/10/2013 1157   KETONESUR NEGATIVE 07/10/2013 1157   PROTEINUR NEGATIVE 07/10/2013 1157   UROBILINOGEN 0.2 07/10/2013 1157   NITRITE NEGATIVE 07/10/2013 1157   LEUKOCYTESUR NEGATIVE 07/10/2013 1157   Sepsis Labs: @LABRCNTIP (procalcitonin:4,lacticidven:4) )No results found for this or any previous visit (from the past 240 hour(s)).   Radiological Exams on Admission: Dg Chest 2 View  Result Date: 03/21/2018 CLINICAL DATA:  Pain after near fall. EXAM: CHEST - 2 VIEW COMPARISON:  10/23/2013  and 11/19/2011 CXR FINDINGS: Stable cardiomegaly with moderate aortic atherosclerosis. No aneurysmal dilatation is identified. The lungs are clear. Lower thoracic compression fractures appears stable, greatest at what is believed to represent the T12 vertebral body and to a lesser degree the superior endplate of G64. IMPRESSION: Moderate aortic atherosclerosis. No active pulmonary disease. Chronic remote lower thoracic compression fractures, greatest at T12. Electronically Signed   By: Ashley Royalty M.D.   On: 03/21/2018 20:28   Dg Pelvis 1-2 Views  Result Date: 03/21/2018 CLINICAL DATA:  Patient stumbled in almost fell prior to arrival. Patient states right hip pain. EXAM: PELVIS - 1-2 VIEW COMPARISON:  None. FINDINGS: Lumbar spondylosis with levoscoliosis is noted with degenerative disc disease most marked at L4-5. Lower lumbar facet arthropathy is also present. The pelvis appears intact without diastasis. Joint spaces are maintained about both hips. IMPRESSION: 1. Lumbar spondylosis with mild levoscoliosis of the included spine. 2. No acute pelvic or hip fracture. No joint dislocations or diastasis. Electronically Signed   By: Ashley Royalty M.D.   On: 03/21/2018 20:23    EKG: Independently reviewed. Sinus rhythm.   Assessment/Plan   1. Occult GI bleeding; normocytic anemia  - Presents from SNF after a fall; she was caught on her way down, had reported lightheadedness, did not loose consciousness or hit head  - Hgb has dropped 4.5 grams since 2015 and there is occult blood in stool, but unknown chronicity of this  - She has not been tachycardic or hypotensive since arrival, does not appear particularly pale  - Type and screen was performed in ED and GI was consulted by ED physician  - Start IV PPI q12h, allow clear liquids per GI, trend H&H    2. Near-syncope  - Presents from SNF after a fall; she was caught on her way down, had reported lightheadedness, did not loose consciousness or hit head  -  Hgb has dropped 4.5 grams since 2015 and there is occult blood in stool, but unknown chronicity of this  - BP and HR have remained stable in ED; she does not appear hypovolemic; unable to cooperate for orthostatic vitals  - Continue gentle IVF hydration and address GIB as above    3. Dementia  - Patient agitated and disoriented in ED  - Continue redirection, address needs, wrap IV access, sitter, prn Haldol if needed  4. Anxiety  - Continue Xanax and Remeron    5. CKD stage III  - SCr is 1.42 on admission, similar to priors  - Renally-dose medications, avoid nephrotoxins    6. COPD  - Does not appear dyspneic, but is wheezing on admission  - CXR clear  - Continue prn nebs    DVT prophylaxis: SCD   Code Status: DNR  Family Communication: Discussed with patient   Consults called: GI  Admission status: inpatient     Vianne Bulls, MD [facility] Pager [phone]  If 7PM-7AM, please contact night-coverage [email or web address] Password [email or web address]  03/21/2018, 11:00 PM

## 2018-03-21 NOTE — ED Triage Notes (Signed)
Per EMS, pt on the memory care floor at [facility] facility.  Reports pt was walking with walker today, stumbled, and residents caught her to prevent her from falling.  Pt c/o hip pain initially but no longer c/o pain after she ate her dinner.  Facility wanted pt evaluated.  Pt alert, denies any pain at this time.  Pt says she doesn't remember almost falling.

## 2018-03-21 NOTE — ED Notes (Signed)
Patient placed on 2 liters of oxygen due to decreased O2 sats with some minor audible wheezing upon ambulation.

## 2018-03-21 NOTE — ED Notes (Signed)
Pt' 98% on room air.  Removed oxygen.  Pt on monitor for bp and SPO2.

## 2018-03-21 NOTE — ED Provider Notes (Signed)
Emergency Department Provider Note   I have reviewed the triage vital signs and the nursing notes.   HISTORY  Chief Complaint Fall   HPI Joanna Mendez is a 84 y.o. female with multiple medical problems as documented below but most pertinently she has Alzheimer's disease does remember anything about what happened and why she is here.  From nursing notes it appears that the patient had a episode where she almost fell but was caught by somebody else.  She had some hip pain at that time but that seems to have resolved this point.  Patient is not complaining any headache, chest pain, back pain or extremity pain. No other associated or modifying symptoms.   Level V Caveat Secondary to Dementia  Past Medical History:  Diagnosis Date  . Allergic rhinitis   . Alzheimer disease   . Anemia   . Anorexia   . Anxiety   . Chronic kidney disease    stage III  . COPD (chronic obstructive pulmonary disease) (HCC)    chronic bronchitis  . Hyperlipidemia   . Hypertension   . Osteoarthritis   . Osteoporosis   . Stroke ([place]) 01/31/2013   TIA  . Vitamin D deficiency     Patient Active Problem List   Diagnosis Date Noted  . Normocytic anemia 03/21/2018  . Near syncope 03/21/2018  . Occult GI bleeding 03/21/2018  . CKD (chronic kidney disease), stage III ([place]) 03/21/2018  . GI bleed 03/21/2018  . Anxiety 03/21/2018  . COPD (chronic obstructive pulmonary disease) ([place]) 03/21/2018  . Anemia   . Rectal bleeding 12/30/2013  . Chest pain 11/19/2011  . Acute renal failure ([place]) 11/19/2011  . HTN (hypertension) 11/19/2011  . Hyperlipidemia 11/19/2011  . Alzheimer disease 11/19/2011    Past Surgical History:  Procedure Laterality Date  . ABDOMINAL HYSTERECTOMY    . COLONOSCOPY N/A 01/15/2014   Procedure: COLONOSCOPY;  Surgeon: Daneil Dolin, MD;  Location[facility];  Service: Endoscopy;  Laterality: N/A;  8:30AM  . KNEE SURGERY    . TONSILLECTOMY      Current Outpatient Rx  .  Order #: [id number] Class: Historical Med  . Order #: [id number] Class: Historical Med  . Order #: [id number] Class: Historical Med  . Order #: [id number] Class: Historical Med  . Order #: [id number] Class: Historical Med  . Order #: [id number] Class: Historical Med  . Order #: [id number] Class: Historical Med  . Order #: [id number] Class: Historical Med  . Order #: [id number] Class: Historical Med  . Order #: [id number] Class: Historical Med  . Order #: [id number] Class: Historical Med  . Order #: [id number] Class: Normal  . Order #: [id number] Class: Historical Med    Allergies Penicillins  Family History  Problem Relation Age of Onset  . Dementia Mother   . Dementia Maternal Grandmother   . Colon cancer Neg Hx     Social History Social History   Tobacco Use  . Smoking status: Never Smoker  . Smokeless tobacco: Never Used  Substance Use Topics  . Alcohol use: No  . Drug use: No    Review of Systems  Level V Caveat Secondary to Dementia ____________________________________________   PHYSICAL EXAM:  VITAL SIGNS: Blood pressure 139/80, pulse 91, temperature 98.2 F (36.8 C), temperature source Oral, resp. rate 16, weight 58.1 kg (128 lb), SpO2 94 %.   Constitutional: Alert and oriented to self only. Well appearing and in no acute distress. Eyes: Conjunctivae are normal. PERRL. EOMI. Head: Atraumatic. Nose: No congestion/rhinnorhea. Mouth/Throat: Mucous membranes are moist.  Oropharynx non-erythematous. Neck: No stridor.  No meningeal signs.   Cardiovascular: Normal rate, regular rhythm. Good peripheral circulation. Grossly normal heart sounds.   Respiratory: Normal respiratory effort.  No retractions. Lungs CTAB. Gastrointestinal: Soft and nontender. No distention.  Musculoskeletal: No cervical spine tenderness, thoracic spine tenderness or Lumbar spine tenderness.  No tenderness or pain with palpation and full ROM of all joints in upper and lower extremities.  No ecchymosis or other  signs of trauma on back or extremities.  No Pain with AP or lateral compression of ribs.  No Paracervical ttp, paraspinal ttp Mild ttp in anterior chest. Neurologic:  Normal speech and language. No gross focal neurologic deficits are appreciated.  Skin:  Skin is warm, dry and intact. No rash noted.  ____________________________________________   LABS (all labs ordered are listed, but only abnormal results are displayed)  Labs Reviewed  CBC WITH DIFFERENTIAL/PLATELET - Abnormal; Notable for the following components:      Result Value   RBC 3.38 (*)    Hemoglobin 8.7 (*)    HCT 29.9 (*)    MCH 25.7 (*)    MCHC 29.1 (*)    RDW 17.8 (*)    All other components within normal limits  BASIC METABOLIC PANEL - Abnormal; Notable for the following components:   CO2 18 (*)    BUN 41 (*)    Creatinine, Ser 1.42 (*)    GFR calc non Af Amer 31 (*)    GFR calc Af Amer 36 (*)    All other components within normal limits  POC OCCULT BLOOD, ED - Abnormal; Notable for the following components:   Fecal Occult Bld POSITIVE (*)    All other components within normal limits  URINALYSIS, ROUTINE W REFLEX MICROSCOPIC  OCCULT BLOOD X 1 CARD TO LAB, STOOL  BASIC METABOLIC PANEL  HEMOGLOBIN  HEMATOCRIT  TYPE AND SCREEN   ____________________________________________  EKG   EKG Interpretation  Date/Time:  Wednesday March 21 2018 19:43:04 EDT Ventricular Rate:  72 PR Interval:    QRS Duration: 91 QT Interval:  426 QTC Calculation: 467 R Axis:   -14 Text Interpretation:  Sinus rhythm Low voltage, precordial leads No significant change since last tracing Confirmed by Merrily Pew [phone]) on 03/21/2018 8:21:39 PM       ____________________________________________  RADIOLOGY  Dg Chest 2 View  Result Date: 03/21/2018 CLINICAL DATA:  Pain after near fall. EXAM: CHEST - 2 VIEW COMPARISON:  10/23/2013 and 11/19/2011 CXR FINDINGS: Stable cardiomegaly with moderate aortic atherosclerosis. No  aneurysmal dilatation is identified. The lungs are clear. Lower thoracic compression fractures appears stable, greatest at what is believed to represent the T12 vertebral body and to a lesser degree the superior endplate of E83. IMPRESSION: Moderate aortic atherosclerosis. No active pulmonary disease. Chronic remote lower thoracic compression fractures, greatest at T12. Electronically Signed   By: Ashley Royalty M.D.   On: 03/21/2018 20:28   Dg Pelvis 1-2 Views  Result Date: 03/21/2018 CLINICAL DATA:  Patient stumbled in almost fell prior to arrival. Patient states right hip pain. EXAM: PELVIS - 1-2 VIEW COMPARISON:  None. FINDINGS: Lumbar spondylosis with levoscoliosis is noted with degenerative disc disease most marked at L4-5. Lower lumbar facet arthropathy is also present. The pelvis appears intact without diastasis. Joint spaces are maintained about both hips. IMPRESSION: 1. Lumbar spondylosis with mild levoscoliosis of the included spine. 2. No acute pelvic or hip fracture. No joint dislocations or diastasis. Electronically Signed   By: Ashley Royalty  M.D.   On: 03/21/2018 20:23    ____________________________________________   PROCEDURES  Procedure(s) performed:   Procedures   ____________________________________________   INITIAL IMPRESSION / ASSESSMENT AND PLAN / ED COURSE  Based on story and exam low suspicion for acute injuries but will eval for generalized weakness.  Anemic. Hemoccult positive. Discussed with Dr. Laural Golden, ok for clear liquids tonight, GI consult in AM. Recheck hb in AM. Discussed with Dr. Myna Hidalgo, will admit for same.   Pertinent labs & imaging results that were available during my care of the patient were reviewed by me and considered in my medical decision making (see chart for details).  ____________________________________________  FINAL CLINICAL IMPRESSION(S) / ED DIAGNOSES  Final diagnoses:  Anemia, unspecified type  Weakness     MEDICATIONS GIVEN DURING  THIS VISIT:  Medications  ALPRAZolam (XANAX) tablet 0.25 mg (has no administration in time range)  Calcium Citrate-Vitamin D 250-200 MG-UNIT TABS 1 tablet (has no administration in time range)  guaiFENesin (MUCINEX) 12 hr tablet 600 mg (has no administration in time range)  Melatonin CAPS 5 mg (has no administration in time range)  mirtazapine (REMERON) tablet 7.5 mg (has no administration in time range)  polyethylene glycol powder (GLYCOLAX/MIRALAX) container 17 g (has no administration in time range)  docusate sodium (COLACE) capsule 100 mg (has no administration in time range)  sodium chloride flush (NS) 0.9 % injection 3 mL (has no administration in time range)  dextrose 5 %-0.45 % sodium chloride infusion (has no administration in time range)  acetaminophen (TYLENOL) tablet 650 mg (has no administration in time range)    Or  acetaminophen (TYLENOL) suppository 650 mg (has no administration in time range)  HYDROcodone-acetaminophen (NORCO/VICODIN) 5-325 MG per tablet 1-2 tablet (has no administration in time range)  ondansetron (ZOFRAN) tablet 4 mg (has no administration in time range)    Or  ondansetron (ZOFRAN) injection 4 mg (has no administration in time range)  pantoprazole (PROTONIX) injection 40 mg (has no administration in time range)  ipratropium-albuterol (DUONEB) 0.5-2.5 (3) MG/3ML nebulizer solution 3 mL (has no administration in time range)  haloperidol lactate (HALDOL) injection 1 mg (has no administration in time range)     NEW OUTPATIENT MEDICATIONS STARTED DURING THIS VISIT:  New Prescriptions   No medications on file    Note:  This note was prepared with assistance of Dragon voice recognition software. Occasional wrong-word or sound-a-like substitutions may have occurred due to the inherent limitations of voice recognition software.   Merrily Pew, MD 03/21/18 [phone]

## 2018-03-22 ENCOUNTER — Other Ambulatory Visit: Payer: Self-pay

## 2018-03-22 DIAGNOSIS — F0281 Dementia in other diseases classified elsewhere with behavioral disturbance: Secondary | ICD-10-CM

## 2018-03-22 DIAGNOSIS — R531 Weakness: Secondary | ICD-10-CM

## 2018-03-22 DIAGNOSIS — F419 Anxiety disorder, unspecified: Secondary | ICD-10-CM

## 2018-03-22 DIAGNOSIS — G309 Alzheimer's disease, unspecified: Secondary | ICD-10-CM

## 2018-03-22 DIAGNOSIS — R195 Other fecal abnormalities: Secondary | ICD-10-CM

## 2018-03-22 DIAGNOSIS — J449 Chronic obstructive pulmonary disease, unspecified: Secondary | ICD-10-CM

## 2018-03-22 DIAGNOSIS — R55 Syncope and collapse: Secondary | ICD-10-CM

## 2018-03-22 DIAGNOSIS — D649 Anemia, unspecified: Secondary | ICD-10-CM

## 2018-03-22 LAB — HEMATOCRIT
HCT: 27.4 % — ABNORMAL LOW (ref 36.0–46.0)
HEMATOCRIT: 29.5 % — AB (ref 36.0–46.0)
HEMATOCRIT: 27 % — AB (ref 36.0–46.0)

## 2018-03-22 LAB — TYPE AND SCREEN
ABO/RH(D): O POS
ANTIBODY SCREEN: NEGATIVE

## 2018-03-22 LAB — BASIC METABOLIC PANEL
BUN: 33 mg/dL — AB (ref 8–23)
CHLORIDE: 111 mmol/L (ref 98–111)
CO2: 27 mmol/L (ref 22–32)
CREATININE: 1.06 mg/dL — AB (ref 0.44–1.00)
Calcium: 8.6 mg/dL — ABNORMAL LOW (ref 8.9–10.3)
GFR calc Af Amer: 51 mL/min — ABNORMAL LOW (ref >60)
GFR calc non Af Amer: 44 mL/min — ABNORMAL LOW (ref >60)
GLUCOSE: 101 mg/dL — AB (ref 70–99)
Potassium: 3.7 mmol/L (ref 3.5–5.1)
Sodium: 140 mmol/L (ref 135–145)

## 2018-03-22 LAB — HEMOGLOBIN
Hemoglobin: 8.1 g/dL — ABNORMAL LOW (ref 12.0–15.0)
Hemoglobin: 8.7 g/dL — ABNORMAL LOW (ref 12.0–15.0)
Hemoglobin: 8.1 g/dL — ABNORMAL LOW (ref 12.0–15.0)

## 2018-03-22 MED ORDER — CALCIUM POLYCARBOPHIL 625 MG PO TABS
625.0000 mg | ORAL_TABLET | Freq: Two times a day (BID) | ORAL | Status: DC
Start: 2018-03-22 — End: 2018-03-23
  Administered 2018-03-22 – 2018-03-23 (×2): 625 mg via ORAL
  Filled 2018-03-22 (×9): qty 1

## 2018-03-22 MED ORDER — PANTOPRAZOLE SODIUM 40 MG PO TBEC
40.0000 mg | DELAYED_RELEASE_TABLET | Freq: Two times a day (BID) | ORAL | Status: DC
  Administered 2018-03-22 – 2018-03-23 (×2): 40 mg via ORAL
  Filled 2018-03-22 (×2): qty 1

## 2018-03-22 MED ORDER — POLYETHYLENE GLYCOL 3350 17 G PO PACK
17.0000 g | PACK | Freq: Every day | ORAL | Status: DC | PRN
Start: 2018-03-22 — End: 2018-03-23

## 2018-03-22 MED ORDER — HALOPERIDOL LACTATE 5 MG/ML IJ SOLN: 1.0000 mg | Freq: Four times a day (QID) | INTRAMUSCULAR | Status: DC | PRN

## 2018-03-22 NOTE — Progress Notes (Signed)
[facility] called and the staff member on site [place] asked questions related the the admission process. -Assessment completed based on the information provided by the papers accompanying the patient as well as the information provided by [place] [employer] loss adjuster member)

## 2018-03-22 NOTE — Progress Notes (Signed)
Patient refusing IV,Dr [facility] notified.Will continue to monitor patient.

## 2018-03-22 NOTE — Consult Note (Addendum)
Referring Provider: [facility] Primary Care Physician:  Caryl Bis, MD Primary Gastroenterologist:  Dr. Gala Romney  Date of Admission: 03/21/18 Date of Consultation: 03/22/18  Reason for Consultation:  Anemia, heme+ stool  HPI:  Joanna Mendez is a 84 y.o. female with a past medical history of Alzheimer's dementia, anemia, anxiety, stage III chronic kidney disease, COPD, TIA.  Patient was last seen by our office on 12/30/2013 for rectal bleeding.  At that time she was noted to have dementia but otherwise fairly healthy.  Her living facility at that time requested referral due to rectal bleeding which was likely low volume.  The patient was unsure if she had seen blood or not, related to her dementia.  Went to colonoscopy at that time which found a solitary rectal ulcer syndrome status post biopsy, colonic diverticulosis, single 8 mm polyp in the mid sigmoid colon.  Surgical pathology found the biopsies to be consistent with solitary rectal ulcer syndrome without evidence of chronicity and the polyp to be benign and consistent with prolapse type polyp or colonic lipoma.  Recommended avoid constipation, add Benefiber, Colace, bedtime MiraLAX as needed.  Also recommend 1 month course of mesalamine suppositories 1 g per rectum at bedtime.   She presented to the emergency department on transfer from her living facility after a near fall.  The patient was unable to contribute to the history secondary to significant dementia and agitation.  Per facility staff she was ambulating with her walker, began to fall and was called by bystander.  She reported some lightheadedness at that time as well as hip pain.  No complaints leading up to her near fall, no vomiting or diarrhea documented recently.  In the emergency department she was saturating 88% on room air and otherwise vitals were normal.  EKG unremarkable.  Lumbar spine, pelvic radiographs unremarkable.  Creatinine 1.42 which is at her baseline.  CBC with a  decline in hemoglobin to 8.7 which was down from 13.4 in 2015; she was normocytic but hypochromic. She does have a history in 2013 of anemia with hemoglobins of 9.0 and 9.6. Fecal occult blood testing was positive.  She was referred for admission on clear liquids and GI was consulted.  She was started on IV PPI as well.  Her hemoglobin on admission yesterday was 8.7 and a repeat hemoglobin this morning found to decline to 8.1.  A review of her "home" medications find stool softener twice daily and MiraLAX as needed.  Today the nursing staff states she was aggitated and pulled out her IV. No reports of bowel movement of GI bleed. The patient is minimally oriented (AAO x 1) and sitter is with her. Denies abdominal pain, N/V, weakness, fatigue. No other GI concerns.  Past Medical History:  Diagnosis Date  . Allergic rhinitis   . Alzheimer disease   . Anemia   . Anorexia   . Anxiety   . Chronic kidney disease    stage III  . COPD (chronic obstructive pulmonary disease) (HCC)    chronic bronchitis  . Hyperlipidemia   . Hypertension   . Osteoarthritis   . Osteoporosis   . Stroke ([place]) 01/31/2013   TIA  . Vitamin D deficiency     Past Surgical History:  Procedure Laterality Date  . ABDOMINAL HYSTERECTOMY    . COLONOSCOPY N/A 01/15/2014   Procedure: COLONOSCOPY;  Surgeon: Daneil Dolin, MD;  Location: [facility];  Service: Endoscopy;  Laterality: N/A;  8:30AM  . KNEE SURGERY    .  TONSILLECTOMY      Prior to Admission medications   Medication Sig Start Date End Date Taking? Authorizing Provider  acetaminophen (TYLENOL) 500 MG tablet Take 500 mg by mouth 3 (three) times daily.   Yes [provider]  ALPRAZolam (XANAX) 0.25 MG tablet Take 0.25 mg by mouth 2 (two) times daily.   Yes [provider]  Calcium Citrate-Vitamin D (CITRACAL/VITAMIN D) 250-200 MG-UNIT TABS Take 1 tablet by mouth 3 (three) times daily.   Yes [provider]  Cholecalciferol (VITAMIN  D) 2000 units CAPS Take 1 capsule by mouth daily.   Yes [provider]  Cranberry 425 MG CAPS Take 425 mg by mouth 2 (two) times daily.    Yes [provider]  guaiFENesin (MUCINEX) 600 MG 12 hr tablet Take 600 mg by mouth every 12 (twelve) hours as needed (congestion).   Yes [provider]  loratadine (CLARITIN) 10 MG tablet Take 10 mg by mouth daily.   Yes [provider]  Melatonin 5 MG CAPS Take 5 mg by mouth at bedtime.   Yes [provider]  mirtazapine (REMERON) 15 MG tablet Take 7.5 mg by mouth at bedtime.    Yes [provider]  mupirocin ointment (BACTROBAN) 2 % Apply 1 application topically 2 (two) times daily. Swelling-infection (knee)   Yes [provider]  polyethylene glycol powder (GLYCOLAX/MIRALAX) powder Take 17 g by mouth daily as needed for mild constipation.   Yes [provider]  STOOL SOFTENER 100 MG capsule TAKE 1 CAPSULE BY MOUTH TWICE DAILY. Patient taking differently: TAKE 1 CAPSULE BY MOUTH DAILY. 05/21/14  Yes Mahala Menghini, PA-C  triamcinolone cream (KENALOG) 0.1 % Apply 1 application topically daily as needed (for affected area(s) Left knee).    [provider]    Current Facility-Administered Medications  Medication Dose Route Frequency Provider Last Rate Last Dose  . acetaminophen (TYLENOL) tablet 650 mg  650 mg Oral Q6H PRN Opyd, Ilene Qua, MD       Or  . acetaminophen (TYLENOL) suppository 650 mg  650 mg Rectal Q6H PRN Opyd, Ilene Qua, MD      . ALPRAZolam Duanne Moron) tablet 0.25 mg  0.25 mg Oral BID Opyd, Ilene Qua, MD   0.25 mg at 03/22/18 0050  . calcium-vitamin D (OSCAL WITH D) 500-200 MG-UNIT per tablet   Oral TID Opyd, Ilene Qua, MD   1 tablet at 03/22/18 0045  . dextrose 5 %-0.45 % sodium chloride infusion   Intravenous Continuous Opyd, Ilene Qua, MD 75 mL/hr at 03/22/18 0045    . docusate sodium (COLACE) capsule 100 mg  100 mg Oral BID Opyd, Ilene Qua, MD   100 mg at  03/22/18 0045  . guaiFENesin (MUCINEX) 12 hr tablet 600 mg  600 mg Oral Q12H PRN Opyd, Ilene Qua, MD      . haloperidol lactate (HALDOL) injection 1 mg  1 mg Intravenous Q6H PRN Opyd, Ilene Qua, MD      . HYDROcodone-acetaminophen (NORCO/VICODIN) 5-325 MG per tablet 1-2 tablet  1-2 tablet Oral Q4H PRN Opyd, Ilene Qua, MD      . ipratropium-albuterol (DUONEB) 0.5-2.5 (3) MG/3ML nebulizer solution 3 mL  3 mL Nebulization Q6H PRN Opyd, Ilene Qua, MD      . mirtazapine (REMERON) tablet 7.5 mg  7.5 mg Oral QHS Opyd, Ilene Qua, MD   7.5 mg at 03/22/18 0045  . ondansetron (ZOFRAN) tablet 4 mg  4 mg Oral Q6H PRN Opyd, Ilene Qua,  MD       Or  . ondansetron (ZOFRAN) injection 4 mg  4 mg Intravenous Q6H PRN Opyd, Ilene Qua, MD      . pantoprazole (PROTONIX) injection 40 mg  40 mg Intravenous Q12H Opyd, Ilene Qua, MD   40 mg at 03/22/18 0045  . polyethylene glycol (MIRALAX / GLYCOLAX) packet 17 g  17 g Oral Daily PRN Johnson, Clanford L, MD      . sodium chloride flush (NS) 0.9 % injection 3 mL  3 mL Intravenous Q12H Opyd, Ilene Qua, MD   3 mL at 03/22/18 0045    Allergies as of 03/21/2018 - Review Complete 03/21/2018  Allergen Reaction Noted  . Penicillins  09/09/2013    Family History  Problem Relation Age of Onset  . Dementia Mother   . Dementia Maternal Grandmother   . Colon cancer Neg Hx     Social History   Socioeconomic History  . Marital status: Widowed    Spouse name: Not on file  . Number of children: Not on file  . Years of education: Not on file  . Highest education level: Not on file  Occupational History  . Not on file  Social Needs  . Financial resource strain: Not on file  . Food insecurity:    Worry: Not on file    Inability: Not on file  . Transportation needs:    Medical: Not on file    Non-medical: Not on file  Tobacco Use  . Smoking status: Never Smoker  . Smokeless tobacco: Never Used  Substance and Sexual Activity  . Alcohol use: No  . Drug use: No  .  Sexual activity: Not Currently  Lifestyle  . Physical activity:    Days per week: Not on file    Minutes per session: Not on file  . Stress: Not on file  Relationships  . Social connections:    Talks on phone: Not on file    Gets together: Not on file    Attends religious service: Not on file    Active member of club or organization: Not on file    Attends meetings of clubs or organizations: Not on file    Relationship status: Not on file  . Intimate partner violence:    Fear of current or ex partner: Not on file    Emotionally abused: Not on file    Physically abused: Not on file    Forced sexual activity: Not on file  Other Topics Concern  . Not on file  Social History Narrative  . Not on file    Review of Systems: Limited due to Alzheimer's, confusion and agitation General: Negative for fatigue, weakness.  CV: Negative for chest pain, dyspnea on exertion.  GI: See history of present illness. Neuro: Negative for weakness.   Physical Exam: Vital signs in last 24 hours: Temp:  [97.6 F (36.4 C)-98.2 F (36.8 C)] 98.2 F (36.8 C) (08/08 0500) Pulse Rate:  [69-91] 70 (08/07 2345) Resp:  [14-23] 19 (08/07 2345) BP: (139-163)/(76-80) 163/76 (08/07 2345) SpO2:  [88 %-99 %] 99 % (08/07 2345) Weight:  [58.1 kg-58.7 kg] 58.7 kg (08/08 0500)   General:   Alert, oriented x 1 (person). Well-developed, well-nourished, pleasant and cooperative in NAD Head:  Normocephalic and atraumatic. Ears:  Hard of hearing. Neck:  Supple; no masses or thyromegaly. Lungs:  Clear throughout to auscultation. No wheezes, crackles, or rhonchi. No acute distress. Heart:  Regular rate and rhythm; no murmurs, clicks,  rubs,  or gallops. Abdomen:  Soft, nontender and nondistended. No masses, hepatosplenomegaly or hernias noted. Normal bowel sounds, without guarding, and without rebound.   Rectal:  Deferred.   Msk:  Symmetrical without gross deformities. Pulses:  Normal bilateral DP pulses  noted. Extremities:  Without clubbing or edema. Neurologic:  Alert and oriented x1;  grossly normal neurologically. Skin:  Intact without significant lesions or rashes. Psych:  Alert and cooperative currently. Normal mood and affect.  Intake/Output from previous day: 08/07 0701 - 08/08 0700 In: 168.8 [I.V.:168.8] Out: -  Intake/Output this shift: No intake/output data recorded.  Lab Results: Recent Labs    03/21/18 1941 03/22/18 0549  WBC 5.7  --   HGB 8.7* 8.1*  HCT 29.9* 27.4*  PLT 338  --    BMET Recent Labs    03/21/18 1941 03/22/18 0549  NA 140 140  K 4.6 3.7  CL 111 111  CO2 18* 27  GLUCOSE 77 101*  BUN 41* 33*  CREATININE 1.42* 1.06*  CALCIUM 8.9 8.6*   LFT No results for input(s): PROT, ALBUMIN, AST, ALT, ALKPHOS, BILITOT, BILIDIR, IBILI in the last 72 hours. PT/INR No results for input(s): LABPROT, INR in the last 72 hours. Hepatitis Panel No results for input(s): HEPBSAG, HCVAB, HEPAIGM, HEPBIGM in the last 72 hours. C-Diff No results for input(s): CDIFFTOX in the last 72 hours.  Studies/Results: Dg Chest 2 View  Result Date: 03/21/2018 CLINICAL DATA:  Pain after near fall. EXAM: CHEST - 2 VIEW COMPARISON:  10/23/2013 and 11/19/2011 CXR FINDINGS: Stable cardiomegaly with moderate aortic atherosclerosis. No aneurysmal dilatation is identified. The lungs are clear. Lower thoracic compression fractures appears stable, greatest at what is believed to represent the T12 vertebral body and to a lesser degree the superior endplate of G25. IMPRESSION: Moderate aortic atherosclerosis. No active pulmonary disease. Chronic remote lower thoracic compression fractures, greatest at T12. Electronically Signed   By: Ashley Royalty M.D.   On: 03/21/2018 20:28   Dg Pelvis 1-2 Views  Result Date: 03/21/2018 CLINICAL DATA:  Patient stumbled in almost fell prior to arrival. Patient states right hip pain. EXAM: PELVIS - 1-2 VIEW COMPARISON:  None. FINDINGS: Lumbar spondylosis with  levoscoliosis is noted with degenerative disc disease most marked at L4-5. Lower lumbar facet arthropathy is also present. The pelvis appears intact without diastasis. Joint spaces are maintained about both hips. IMPRESSION: 1. Lumbar spondylosis with mild levoscoliosis of the included spine. 2. No acute pelvic or hip fracture. No joint dislocations or diastasis. Electronically Signed   By: Ashley Royalty M.D.   On: 03/21/2018 20:23    Impression: Currently pleasant 84 year old female with a known history of significant Alzheimer's disease.  She has had episodes of confusion and agitation.  Today she is oriented x1 and has difficulty providing an oral history.  She denies any significant GI symptoms at this time.  She was last seen by our service in 2015 for rectal bleeding and was found to have solitary rectal ulcer syndrome.  She was given recommendations for daily fiber, daily stool softener, MiraLAX at bedtime as needed.  She presents today after a near fall at her living facility where she was caught by a bystander.  She noted some minor fatigue at that time.  In the emergency room she was found to be anemic with a hemoglobin of 8.7.  Unfortunately, we have limited records and her last hemoglobin in our system was in 2015 where she was found to have a normal hemoglobin  of 13.4.  However, in 2013 she did have ongoing anemia with hemoglobins ranging from 9.0 to 9.6.  She is unable to answer questions about constipation at this time.  She is unable to answer if she is seeing any rectal bleeding.  No obvious GI bleed since admission to the facility, per nursing staff.  Her residential staff noted no GI complaints leading up to her near fall. She did have heme+ stool in the ER  Given her history, she likely has a mild GI bleed with unknown chronicity with a likely culprit of solitary rectal ulcer syndrome.  She did have sigmoid colon diverticula but otherwise no diverticular disease.  Possible etiologies of  persistent solitary rectal ulcer syndrome.  Possible diverticular bleed, although I would presume nursing facility staff would have noted significant lower GI bleed.  Less likely colon cancer, inflammatory bowel disease.  At this time given her age and comorbidities as well as confusion she is likely a difficult prep and high risk endoscopic evaluation.  At this point we will proceed with conservative treatment.  We will have nursing staff notify us of any significant GI symptoms including GI bleed.  Plan: 1. Monitor hemoglobin closely. 2. Monitor for any obvious GI bleed and notify us of any. 3. Continue home stool softeners. 4. Add daily fiber. 5. MiraLAX as needed. 6. Transfuse as needed. 7. Supportive measures   Thank you for allowing [place] to participate in the care of Morgan, DNP, AGNP-C Adult & Gerontological Nurse Practitioner [facility]    LOS: 1 day     03/22/2018, 8:05 AM

## 2018-03-22 NOTE — Plan of Care (Signed)
Ambulating in room,safety sitter at side,No visual distress noted at present.

## 2018-03-22 NOTE — Progress Notes (Addendum)
PROGRESS NOTE  Joanna Mendez  AQT:[id number]  DOB: [birth year]  DOA: 03/21/2018 PCP: Caryl Bis, MD   Brief Admission Hx: Joanna Mendez is a 84 y.o. female with medical history significant for dementia, anxiety, COPD, and chronic kidney disease stage III, now presenting to the emergency department from her SNF after a fall. She was found to have anemia with heme positive stool.   MDM/Assessment & Plan:   1. Chronic blood loss anemia - secondary to GI bleed as evidenced by heme positive stool.  Appreciate GI consult.  Continue to monitor for signs of more bleeding.  Type and screen has been done.  PPI therapy started. Clear liquid diet.  2. Near syncope - no loss of consciousness.  She has some orthostasis and dehydration being treated with IV fluids.  Fall precautions. BP and HR stable.  PT eval.  Still waiting on urinalysis.  Pt will not allow RN to cath at this time.  3. Dementia - severe disease requiring sitter, haldol as needed. Pt refusing IV.  IM haldol ordered as needed.  4. GAD - continue oral alprazolam and remeron. 5. Stage 3 CKD - creatinine has improved with IV fluid hydration.  6. COPD - nebs ordered as needed.   7. No IV access - Pt will not allow IV to be placed at this time.  Changed haldol to IM. Other meds are oral.    DVT prophylaxis: SCDs Code Status: DNR  Family Communication: none present during rounds  Disposition Plan: transfer to med surg   Consultants:  GI   Subjective: Pt is demented but has no complaints at this time.  Safety sitter at bedside.     Objective: Vitals:   03/21/18 2230 03/21/18 2232 03/21/18 2345 03/22/18 0500  BP:   (!) 163/76   Pulse:   70   Resp: 14 17 19    Temp:   97.6 F (36.4 C) 98.2 F (36.8 C)  TempSrc:   Oral   SpO2:   99%   Weight:   58.1 kg 58.7 kg  Height:   5' (1.524 m)     Intake/Output Summary (Last 24 hours) at 03/22/2018 1114 Last data filed at 03/22/2018 0300 Gross per 24 hour  Intake 168.75 ml  Output -    Net 168.75 ml   Filed Weights   03/21/18 1804 03/21/18 2345 03/22/18 0500  Weight: 58.1 kg 58.1 kg 58.7 kg     REVIEW OF SYSTEMS  UTO as patient is severely demented  Exam:  General exam: elderly female, awake, alert, confused/demented, no distress Respiratory system: rare expiratory wheezes. No increased work of breathing. Cardiovascular system: normal S1 & S2 heard.  Gastrointestinal system: Abdomen is nondistended, soft and nontender. Normal bowel sounds heard. Central nervous system: Alert and awake. No gross focal neurological deficits. Pt has severe dementia.  Extremities: no CCE.  Data Reviewed: Basic Metabolic Panel: Recent Labs  Lab 03/21/18 1941 03/22/18 0549  NA 140 140  K 4.6 3.7  CL 111 111  CO2 18* 27  GLUCOSE 77 101*  BUN 41* 33*  CREATININE 1.42* 1.06*  CALCIUM 8.9 8.6*   Liver Function Tests: No results for input(s): AST, ALT, ALKPHOS, BILITOT, PROT, ALBUMIN in the last 168 hours. No results for input(s): LIPASE, AMYLASE in the last 168 hours. No results for input(s): AMMONIA in the last 168 hours. CBC: Recent Labs  Lab 03/21/18 1941 03/22/18 0549  WBC 5.7  --   NEUTROABS 3.0  --  HGB 8.7* 8.1*  HCT 29.9* 27.4*  MCV 88.5  --   PLT 338  --    Cardiac Enzymes: No results for input(s): CKTOTAL, CKMB, CKMBINDEX, TROPONINI in the last 168 hours. CBG (last 3)  No results for input(s): GLUCAP in the last 72 hours. No results found for this or any previous visit (from the past 240 hour(s)).   Studies: Dg Chest 2 View  Result Date: 03/21/2018 CLINICAL DATA:  Pain after near fall. EXAM: CHEST - 2 VIEW COMPARISON:  10/23/2013 and 11/19/2011 CXR FINDINGS: Stable cardiomegaly with moderate aortic atherosclerosis. No aneurysmal dilatation is identified. The lungs are clear. Lower thoracic compression fractures appears stable, greatest at what is believed to represent the T12 vertebral body and to a lesser degree the superior endplate of G38.  IMPRESSION: Moderate aortic atherosclerosis. No active pulmonary disease. Chronic remote lower thoracic compression fractures, greatest at T12. Electronically Signed   By: Ashley Royalty M.D.   On: 03/21/2018 20:28   Dg Pelvis 1-2 Views  Result Date: 03/21/2018 CLINICAL DATA:  Patient stumbled in almost fell prior to arrival. Patient states right hip pain. EXAM: PELVIS - 1-2 VIEW COMPARISON:  None. FINDINGS: Lumbar spondylosis with levoscoliosis is noted with degenerative disc disease most marked at L4-5. Lower lumbar facet arthropathy is also present. The pelvis appears intact without diastasis. Joint spaces are maintained about both hips. IMPRESSION: 1. Lumbar spondylosis with mild levoscoliosis of the included spine. 2. No acute pelvic or hip fracture. No joint dislocations or diastasis. Electronically Signed   By: Ashley Royalty M.D.   On: 03/21/2018 20:23   Scheduled Meds: . ALPRAZolam  0.25 mg Oral BID  . calcium-vitamin D   Oral TID  . docusate sodium  100 mg Oral BID  . mirtazapine  7.5 mg Oral QHS  . pantoprazole  40 mg Intravenous Q12H  . polycarbophil  625 mg Oral BID  . sodium chloride flush  3 mL Intravenous Q12H   Continuous Infusions:  Principal Problem:   Occult GI bleeding Active Problems:   Alzheimer disease   Normocytic anemia   Near syncope   CKD (chronic kidney disease), stage III (HCC)   Anxiety   COPD (chronic obstructive pulmonary disease) (HCC)   Weakness  Time spent:   Irwin Brakeman, MD, FAAFP [facility] Pager [phone]  If 7PM-7AM, please contact night-coverage [email or web address] Password [email or web address] 03/22/2018, 11:14 AM    LOS: 1 day

## 2018-03-23 ENCOUNTER — Inpatient Hospital Stay (HOSPITAL_COMMUNITY): Payer: Medicare Other

## 2018-03-23 DIAGNOSIS — N183 Chronic kidney disease, stage 3 (moderate): Secondary | ICD-10-CM

## 2018-03-23 MED ORDER — CALCIUM POLYCARBOPHIL 625 MG PO TABS: 625.0000 mg | ORAL_TABLET | Freq: Two times a day (BID) | ORAL | Status: DC

## 2018-03-23 MED ORDER — FERROUS GLUCONATE 324 (38 FE) MG PO TABS: 324.0000 mg | ORAL_TABLET | Freq: Every day | ORAL | 3 refills | Status: DC

## 2018-03-23 MED ORDER — PANTOPRAZOLE SODIUM 40 MG PO TBEC: 40.0000 mg | DELAYED_RELEASE_TABLET | Freq: Every day | ORAL | Status: DC

## 2018-03-23 NOTE — Care Management Important Message (Signed)
Important Message  Patient Details  Name: TONI DEMO MRN: [id number] Date of Birth: June 02, 1924   Medicare Important Message Given:  Yes Unable to sign due to weakness.   Holli Humbles Smith 03/23/2018, 12:29 PM

## 2018-03-23 NOTE — Progress Notes (Signed)
Patient discharged home today per MD orders. Patient vital signs WDL. IV removed and site WDL. Discharge Instructions including follow up appointments, medications, and education reviewed with patient and Crystal, Rn at [facility]. Patient verbalizes understanding. Patient is transported out via wheelchair.

## 2018-03-23 NOTE — [place] FL2 (Signed)
MEDICAID FL2 LEVEL OF CARE SCREENING TOOL     IDENTIFICATION  Patient Name: Joanna Mendez Birthdate: 1924/04/17 Sex: female Admission Date (Current Location): 03/21/2018  [facility] and [place] Number:  [employer] and Address:  [facility] [address] [place]      Provider Number: [id number]  Attending Physician Name and Address:  Kathie Dike, MD  Relative Name and Phone Number:       Current Level of Care: Hospital Recommended Level of Care: [place] Prior Approval Number:    Date Approved/Denied:   PASRR Number:    Discharge Plan: Other (Comment)(ALF)    Current Diagnoses: Patient Active Problem List   Diagnosis Date Noted  . Weakness   . Normocytic anemia 03/21/2018  . Near syncope 03/21/2018  . Occult GI bleeding 03/21/2018  . CKD (chronic kidney disease), stage III ([place]) 03/21/2018  . GI bleed 03/21/2018  . Anxiety 03/21/2018  . COPD (chronic obstructive pulmonary disease) ([place]) 03/21/2018  . Anemia   . Rectal bleeding 12/30/2013  . Chest pain 11/19/2011  . Acute renal failure ([place]) 11/19/2011  . HTN (hypertension) 11/19/2011  . Hyperlipidemia 11/19/2011  . Alzheimer disease 11/19/2011    Orientation RESPIRATION BLADDER Height & Weight     Self, Place  Normal Continent Weight: 129 lb 6.6 oz (58.7 kg) Height:  5' (152.4 cm)  BEHAVIORAL SYMPTOMS/MOOD NEUROLOGICAL BOWEL NUTRITION STATUS      Continent Diet(Heart healthy)  AMBULATORY STATUS COMMUNICATION OF NEEDS Skin   Limited Assist Verbally Normal                       Personal Care Assistance Level of Assistance  Bathing, Feeding, Dressing Bathing Assistance: Limited assistance Feeding assistance: Independent Dressing Assistance: Limited assistance     Functional Limitations Info  Sight, Hearing, Speech Sight Info: Adequate Hearing Info: Adequate Speech Info: Adequate    SPECIAL CARE FACTORS FREQUENCY                        Contractures Contractures Info: Not present    Additional Factors Info  Code Status, Allergies, Psychotropic Code Status Info: DNR Allergies Info: Penicillins Psychotropic Info: Xanax, Remeron         Current Medications (03/23/2018):  This is the current hospital active medication list Current Facility-Administered Medications  Medication Dose Route Frequency Provider Last Rate Last Dose  . acetaminophen (TYLENOL) tablet 650 mg  650 mg Oral Q6H PRN Opyd, Ilene Qua, MD       Or  . acetaminophen (TYLENOL) suppository 650 mg  650 mg Rectal Q6H PRN Opyd, Ilene Qua, MD      . ALPRAZolam Duanne Moron) tablet 0.25 mg  0.25 mg Oral BID Opyd, Ilene Qua, MD   0.25 mg at 03/23/18 0918  . calcium-vitamin D (OSCAL WITH D) 500-200 MG-UNIT per tablet   Oral TID Vianne Bulls, MD   1 tablet at 03/23/18 [phone]  . docusate sodium (COLACE) capsule 100 mg  100 mg Oral BID Vianne Bulls, MD   100 mg at 03/23/18 0918  . guaiFENesin (MUCINEX) 12 hr tablet 600 mg  600 mg Oral Q12H PRN Opyd, Ilene Qua, MD      . haloperidol lactate (HALDOL) injection 1 mg  1 mg Intramuscular Q6H PRN Johnson, Clanford L, MD      . HYDROcodone-acetaminophen (NORCO/VICODIN) 5-325 MG per tablet 1-2 tablet  1-2 tablet Oral Q4H PRN  Opyd, Ilene Qua, MD      . ipratropium-albuterol (DUONEB) 0.5-2.5 (3) MG/3ML nebulizer solution 3 mL  3 mL Nebulization Q6H PRN Opyd, Ilene Qua, MD      . mirtazapine (REMERON) tablet 7.5 mg  7.5 mg Oral QHS Opyd, Ilene Qua, MD   7.5 mg at 03/22/18 2231  . ondansetron (ZOFRAN) tablet 4 mg  4 mg Oral Q6H PRN Opyd, Ilene Qua, MD      . pantoprazole (PROTONIX) EC tablet 40 mg  40 mg Oral BID Wynetta Emery, Clanford L, MD   40 mg at 03/23/18 0918  . polycarbophil (FIBERCON) tablet 625 mg  625 mg Oral BID Walden Field A, NP   625 mg at 03/23/18 0919  . polyethylene glycol (MIRALAX / GLYCOLAX) packet 17 g  17 g Oral Daily PRN Johnson, Clanford L, MD      . sodium chloride flush (NS) 0.9 % injection 3 mL  3 mL  Intravenous Q12H Opyd, Ilene Qua, MD   3 mL at 03/23/18 0920     Discharge Medications: Medication List    TAKE these medications   acetaminophen 500 MG tablet Commonly known as:  TYLENOL Take 500 mg by mouth 3 (three) times daily.   ALPRAZolam 0.25 MG tablet Commonly known as:  XANAX Take 0.25 mg by mouth 2 (two) times daily.   CITRACAL/VITAMIN D 250-200 MG-UNIT Tabs Generic drug:  Calcium Citrate-Vitamin D Take 1 tablet by mouth 3 (three) times daily.   Cranberry 425 MG Caps Take 425 mg by mouth 2 (two) times daily.   ferrous gluconate 324 MG tablet Commonly known as:  FERGON Take 1 tablet (324 mg total) by mouth daily with breakfast.   guaiFENesin 600 MG 12 hr tablet Commonly known as:  MUCINEX Take 600 mg by mouth every 12 (twelve) hours as needed (congestion).   loratadine 10 MG tablet Commonly known as:  CLARITIN Take 10 mg by mouth daily.   Melatonin 5 MG Caps Take 5 mg by mouth at bedtime.   mirtazapine 15 MG tablet Commonly known as:  REMERON Take 7.5 mg by mouth at bedtime.   mupirocin ointment 2 % Commonly known as:  BACTROBAN Apply 1 application topically 2 (two) times daily. Swelling-infection (knee)   pantoprazole 40 MG tablet Commonly known as:  PROTONIX Take 1 tablet (40 mg total) by mouth daily.   polycarbophil 625 MG tablet Commonly known as:  FIBERCON Take 1 tablet (625 mg total) by mouth 2 (two) times daily.   polyethylene glycol powder powder Commonly known as:  GLYCOLAX/MIRALAX Take 17 g by mouth daily as needed for mild constipation.   STOOL SOFTENER 100 MG capsule Generic drug:  docusate sodium TAKE 1 CAPSULE BY MOUTH TWICE DAILY. What changed:    how much to take  how to take this  when to take this   triamcinolone cream 0.1 % Commonly known as:  KENALOG Apply 1 application topically daily as needed (for affected area(s) Left knee).   Vitamin D 2000 units Caps Take 1 capsule by mouth daily.       Relevant Imaging Results:  Relevant Lab Results:   Additional Information    Rettie Laird, Clydene Pugh, LCSW

## 2018-03-23 NOTE — Discharge Summary (Signed)
Physician Discharge Summary  Joanna Mendez:[id number] DOB: [birth year] DOA: 03/21/2018  PCP: Caryl Bis, MD  Admit date: 03/21/2018 Discharge date: 03/23/2018  Admitted From: SNF Disposition:  SNF  Recommendations for Outpatient Follow-up:  1. Follow up with PCP in 1-2 weeks 2. Please obtain BMP/CBC in one week 3. Follow up with gastroenterology as needed  Discharge Condition: stable CODE STATUS: DNR Diet recommendation: heart healthy  Brief/Interim Summary: 84 year old female with a history of dementia, COPD, chronic kidney disease stage III, was sent to the hospital from nursing home after having a fall.  Patient was reportedly lightheaded.  On arrival to the emergency room, vitals were noted to be stable, but she was noted to have an elevated BUN of 41 and a creatinine of 1.42.  She also had a hemoglobin of 8.7, down from 13.4 in 2015.  She was also fecal occult blood positive, but did not have any gross evidence of bleeding.  She was admitted to the hospital for observation.  She was seen by gastroenterology who felt that conservative management with observation was reasonable.  No intervention would be undertaken unless she had gross evidence of bleeding.  Patient was monitored in the hospital did not have any evidence of gross bleeding.  Her hemoglobin has remained stable in the hospital.  She was given IV fluids and her overall renal function has improved.  I suspect that her falling and lightheadedness may be related to some degree of dehydration.  She will need further follow-up of her anemia with a hemoglobin in 1 week.  She is been placed on stool softeners as well as PPI.  She is not on any NSAIDs.  Patient appears to be otherwise stable and ready for discharge back to skilled nursing facility.  Discharge Diagnoses:  Principal Problem:   Occult GI bleeding Active Problems:   Alzheimer disease   Normocytic anemia   Near syncope   CKD (chronic kidney disease), stage III  (HCC)   Anxiety   COPD (chronic obstructive pulmonary disease) ([place])   Weakness    Discharge Instructions  Discharge Instructions    Diet - low sodium heart healthy   Complete by:  As directed    Increase activity slowly   Complete by:  As directed      Allergies as of 03/23/2018      Reactions   Penicillins    Unknown-not listed on current MAR      Medication List    TAKE these medications   acetaminophen 500 MG tablet Commonly known as:  TYLENOL Take 500 mg by mouth 3 (three) times daily.   ALPRAZolam 0.25 MG tablet Commonly known as:  XANAX Take 0.25 mg by mouth 2 (two) times daily.   CITRACAL/VITAMIN D 250-200 MG-UNIT Tabs Generic drug:  Calcium Citrate-Vitamin D Take 1 tablet by mouth 3 (three) times daily.   Cranberry 425 MG Caps Take 425 mg by mouth 2 (two) times daily.   ferrous gluconate 324 MG tablet Commonly known as:  FERGON Take 1 tablet (324 mg total) by mouth daily with breakfast.   guaiFENesin 600 MG 12 hr tablet Commonly known as:  MUCINEX Take 600 mg by mouth every 12 (twelve) hours as needed (congestion).   loratadine 10 MG tablet Commonly known as:  CLARITIN Take 10 mg by mouth daily.   Melatonin 5 MG Caps Take 5 mg by mouth at bedtime.   mirtazapine 15 MG tablet Commonly known as:  REMERON Take 7.5 mg by mouth at  bedtime.   mupirocin ointment 2 % Commonly known as:  BACTROBAN Apply 1 application topically 2 (two) times daily. Swelling-infection (knee)   pantoprazole 40 MG tablet Commonly known as:  PROTONIX Take 1 tablet (40 mg total) by mouth daily.   polycarbophil 625 MG tablet Commonly known as:  FIBERCON Take 1 tablet (625 mg total) by mouth 2 (two) times daily.   polyethylene glycol powder powder Commonly known as:  GLYCOLAX/MIRALAX Take 17 g by mouth daily as needed for mild constipation.   STOOL SOFTENER 100 MG capsule Generic drug:  docusate sodium TAKE 1 CAPSULE BY MOUTH TWICE DAILY. What changed:    how much  to take  how to take this  when to take this   triamcinolone cream 0.1 % Commonly known as:  KENALOG Apply 1 application topically daily as needed (for affected area(s) Left knee).   Vitamin D 2000 units Caps Take 1 capsule by mouth daily.       Allergies  Allergen Reactions  . Penicillins     Unknown-not listed on current MAR    Consultations:  gastroenterology   Procedures/Studies: Dg Chest 2 View  Result Date: 03/21/2018 CLINICAL DATA:  Pain after near fall. EXAM: CHEST - 2 VIEW COMPARISON:  10/23/2013 and 11/19/2011 CXR FINDINGS: Stable cardiomegaly with moderate aortic atherosclerosis. No aneurysmal dilatation is identified. The lungs are clear. Lower thoracic compression fractures appears stable, greatest at what is believed to represent the T12 vertebral body and to a lesser degree the superior endplate of F02. IMPRESSION: Moderate aortic atherosclerosis. No active pulmonary disease. Chronic remote lower thoracic compression fractures, greatest at T12. Electronically Signed   By: Ashley Royalty M.D.   On: 03/21/2018 20:28   Dg Pelvis 1-2 Views  Result Date: 03/21/2018 CLINICAL DATA:  Patient stumbled in almost fell prior to arrival. Patient states right hip pain. EXAM: PELVIS - 1-2 VIEW COMPARISON:  None. FINDINGS: Lumbar spondylosis with levoscoliosis is noted with degenerative disc disease most marked at L4-5. Lower lumbar facet arthropathy is also present. The pelvis appears intact without diastasis. Joint spaces are maintained about both hips. IMPRESSION: 1. Lumbar spondylosis with mild levoscoliosis of the included spine. 2. No acute pelvic or hip fracture. No joint dislocations or diastasis. Electronically Signed   By: Ashley Royalty M.D.   On: 03/21/2018 20:23   US Venous Img Lower Unilateral Right  Result Date: 03/23/2018 CLINICAL DATA:  Right lower extremity pain.  Fall 4 days ago. EXAM: RIGHT LOWER EXTREMITY VENOUS DUPLEX ULTRASOUND TECHNIQUE: Doppler venous assessment  of the right lower extremity deep venous system was performed, including characterization of spectral flow, compressibility, and phasicity. COMPARISON:  None. FINDINGS: There is complete compressibility of the right common femoral, femoral, and popliteal veins. Doppler analysis demonstrates respiratory phasicity and augmentation of flow with calf compression. No obvious superficial vein or calf vein thrombosis. IMPRESSION: No evidence of right lower extremity DVT. Electronically Signed   By: Marybelle Killings M.D.   On: 03/23/2018 12:07       Subjective: Confused, complains of some pain in her right calf.  Discharge Exam: Vitals:   03/23/18 0626 03/23/18 1300  BP: (!) 147/78 (!) 159/88  Pulse: 88 69  Resp: 16 (!) 24  Temp: 98.1 F (36.7 C) 98.3 F (36.8 C)  SpO2: 93% 97%   Vitals:   03/22/18 2022 03/22/18 2118 03/23/18 0626 03/23/18 1300  BP:  (!) 148/90 (!) 147/78 (!) 159/88  Pulse:  98 88 69  Resp:  16 16 (!)  24  Temp:  98.8 F (37.1 C) 98.1 F (36.7 C) 98.3 F (36.8 C)  TempSrc:  Oral Oral Oral  SpO2: 93% 93% 93% 97%  Weight:      Height:        General: Pt is alert, awake, not in acute distress Cardiovascular: RRR, S1/S2 +, no rubs, no gallops Respiratory: CTA bilaterally, no wheezing, no rhonchi Abdominal: Soft, NT, ND, bowel sounds + Extremities: no edema, no cyanosis    The results of significant diagnostics from this hospitalization (including imaging, microbiology, ancillary and laboratory) are listed below for reference.     Microbiology: No results found for this or any previous visit (from the past 240 hour(s)).   Labs: BNP (last 3 results) No results for input(s): BNP in the last 8760 hours. Basic Metabolic Panel: Recent Labs  Lab 03/21/18 1941 03/22/18 0549  NA 140 140  K 4.6 3.7  CL 111 111  CO2 18* 27  GLUCOSE 77 101*  BUN 41* 33*  CREATININE 1.42* 1.06*  CALCIUM 8.9 8.6*   Liver Function Tests: No results for input(s): AST, ALT, ALKPHOS,  BILITOT, PROT, ALBUMIN in the last 168 hours. No results for input(s): LIPASE, AMYLASE in the last 168 hours. No results for input(s): AMMONIA in the last 168 hours. CBC: Recent Labs  Lab 03/21/18 1941 03/22/18 0549 03/22/18 1312 03/22/18 2108  WBC 5.7  --   --   --   NEUTROABS 3.0  --   --   --   HGB 8.7* 8.1* 8.7* 8.1*  HCT 29.9* 27.4* 29.5* 27.0*  MCV 88.5  --   --   --   PLT 338  --   --   --    Cardiac Enzymes: No results for input(s): CKTOTAL, CKMB, CKMBINDEX, TROPONINI in the last 168 hours. BNP: Invalid input(s): POCBNP CBG: No results for input(s): GLUCAP in the last 168 hours. D-Dimer No results for input(s): DDIMER in the last 72 hours. Hgb A1c No results for input(s): HGBA1C in the last 72 hours. Lipid Profile No results for input(s): CHOL, HDL, LDLCALC, TRIG, CHOLHDL, LDLDIRECT in the last 72 hours. Thyroid function studies No results for input(s): TSH, T4TOTAL, T3FREE, THYROIDAB in the last 72 hours.  Invalid input(s): FREET3 Anemia work up No results for input(s): VITAMINB12, FOLATE, FERRITIN, TIBC, IRON, RETICCTPCT in the last 72 hours. Urinalysis    Component Value Date/Time   COLORURINE YELLOW 07/10/2013 [place] 07/10/2013 1157   LABSPEC 1.020 07/10/2013 1157   PHURINE 6.0 07/10/2013 1157   GLUCOSEU NEGATIVE 07/10/2013 1157   HGBUR TRACE (A) 07/10/2013 [place] 07/10/2013 1157   KETONESUR NEGATIVE 07/10/2013 1157   PROTEINUR NEGATIVE 07/10/2013 1157   UROBILINOGEN 0.2 07/10/2013 1157   NITRITE NEGATIVE 07/10/2013 1157   LEUKOCYTESUR NEGATIVE 07/10/2013 1157   Sepsis Labs Invalid input(s): PROCALCITONIN,  WBC,  LACTICIDVEN Microbiology No results found for this or any previous visit (from the past 240 hour(s)).   Time coordinating discharge: 56mins  SIGNED:   Kathie Dike, MD  [facility] 03/23/2018, 2:55 PM Pager   If 7PM-7AM, please contact night-coverage [email or web address] Password [email or web address]

## 2021-02-05 ENCOUNTER — Emergency Department (HOSPITAL_COMMUNITY): Payer: Medicare Other

## 2021-02-05 ENCOUNTER — Other Ambulatory Visit: Payer: Self-pay

## 2021-02-05 ENCOUNTER — Emergency Department (HOSPITAL_COMMUNITY)
Admission: EM | Admit: 2021-02-05 | Discharge: 2021-02-05 | Disposition: A | Discharge status: Skilled Nursing Facility | Payer: Medicare Other | Attending: Emergency Medicine | Admitting: Emergency Medicine

## 2021-02-05 DIAGNOSIS — G309 Alzheimer's disease, unspecified: Secondary | ICD-10-CM | POA: Insufficient documentation

## 2021-02-05 DIAGNOSIS — W19XXXA Unspecified fall, initial encounter: Secondary | ICD-10-CM | POA: Diagnosis not present

## 2021-02-05 DIAGNOSIS — J449 Chronic obstructive pulmonary disease, unspecified: Secondary | ICD-10-CM | POA: Insufficient documentation

## 2021-02-05 DIAGNOSIS — I129 Hypertensive chronic kidney disease with stage 1 through stage 4 chronic kidney disease, or unspecified chronic kidney disease: Secondary | ICD-10-CM | POA: Diagnosis not present

## 2021-02-05 DIAGNOSIS — S52571A Other intraarticular fracture of lower end of right radius, initial encounter for closed fracture: Secondary | ICD-10-CM | POA: Diagnosis not present

## 2021-02-05 DIAGNOSIS — N183 Chronic kidney disease, stage 3 unspecified: Secondary | ICD-10-CM | POA: Diagnosis not present

## 2021-02-05 DIAGNOSIS — S6991XA Unspecified injury of right wrist, hand and finger(s), initial encounter: Secondary | ICD-10-CM | POA: Diagnosis present

## 2021-02-05 DIAGNOSIS — S62101A Fracture of unspecified carpal bone, right wrist, initial encounter for closed fracture: Secondary | ICD-10-CM

## 2021-02-05 MED ORDER — HYDROCODONE-ACETAMINOPHEN 5-325 MG PO TABS
1.0000 | ORAL_TABLET | Freq: Once | ORAL | Status: AC
  Administered 2021-02-05: 1 via ORAL
  Filled 2021-02-05: qty 1

## 2021-02-05 NOTE — ED Notes (Signed)
Patient is very combative when we are attempting to obtain vital signs.  Therefore due to her moving around and attempting to fight [place], we are unable to get a accurate vital sign reading

## 2021-02-05 NOTE — ED Triage Notes (Signed)
Pt from [facility] [place] [facility]. Per EMS pt was found behind a door on the floor in her room. Pt was noted to have deformity of right wrist and swelling.

## 2021-02-05 NOTE — ED Provider Notes (Signed)
[facility] EMERGENCY DEPARTMENT Provider Note  CSN: [id number] Arrival date & time: 02/05/21 5809    History Chief Complaint  Patient presents with   Fall     Fall   Joanna Mendez is a 87 y.o. female with history of dementia found on the floor of her room at SNF this morning. Unwitnessed fall. Unknown head injury but no bruising or lacerations noted. EMS noted swelling of her R wrist. She is unable to provide any additional history. Level 5 caveat applies.    Past Medical History:  Diagnosis Date   Allergic rhinitis    Alzheimer disease    Anemia    Anorexia    Anxiety    Chronic kidney disease    stage III   COPD (chronic obstructive pulmonary disease) (HCC)    chronic bronchitis   Hyperlipidemia    Hypertension    Osteoarthritis    Osteoporosis    Stroke ([place]) 01/31/2013   TIA   Vitamin D deficiency     Past Surgical History:  Procedure Laterality Date   ABDOMINAL HYSTERECTOMY     COLONOSCOPY N/A 01/15/2014   Procedure: COLONOSCOPY;  Surgeon: Daneil Dolin, MD;  [facility]: [facility];  Service: Endoscopy;  Laterality: N/A;  8:30AM   KNEE SURGERY     TONSILLECTOMY      Family History  Problem Relation Age of Onset   Dementia Mother    Dementia Maternal Grandmother    Colon cancer Neg Hx     Social History   Tobacco Use   Smoking status: Never   Smokeless tobacco: Never  Substance Use Topics   Alcohol use: No   Drug use: No     Home Medications Prior to Admission medications   Medication Sig Start Date End Date Taking? Authorizing Provider  acetaminophen (TYLENOL) 500 MG tablet Take 500 mg by mouth 3 (three) times daily.   Yes [provider]  ALPRAZolam (XANAX) 0.25 MG tablet Take 0.25 mg by mouth 2 (two) times daily.   Yes [provider]  Calcium Citrate-Vitamin D 250-200 MG-UNIT TABS Take 1 tablet by mouth 3 (three) times daily.   Yes [provider]  Cholecalciferol (VITAMIN D) 2000 units CAPS Take 1 capsule  by mouth daily.   Yes [provider]  divalproex (DEPAKOTE) 125 MG DR tablet Take 125 mg by mouth 2 (two) times daily. 01/28/21  Yes [provider]  mirtazapine (REMERON) 15 MG tablet Take 7.5 mg by mouth at bedtime.    Yes [provider]  polycarbophil (FIBERCON) 625 MG tablet Take 1 tablet (625 mg total) by mouth 2 (two) times daily. 03/23/18  Yes Memon, Jolaine Artist, MD  STOOL SOFTENER 100 MG capsule TAKE 1 CAPSULE BY MOUTH TWICE DAILY. 05/21/14  Yes Mahala Menghini, PA-C  ferrous gluconate (FERGON) 324 MG tablet Take 1 tablet (324 mg total) by mouth daily with breakfast. Patient not taking: No sig reported 03/23/18   Kathie Dike, MD  guaiFENesin (MUCINEX) 600 MG 12 hr tablet Take 600 mg by mouth every 12 (twelve) hours as needed (congestion). Patient not taking: Reported on 02/05/2021    [provider]  Melatonin 5 MG CAPS Take 5 mg by mouth at bedtime.    [provider]  pantoprazole (PROTONIX) 40 MG tablet Take 1 tablet (40 mg total) by mouth daily. Patient not taking: Reported on 02/05/2021 03/23/18   Kathie Dike, MD     Allergies    Penicillins   Review  of Systems   Review of Systems Unable to assess due to mental status.    Physical Exam BP (!) 156/126 (BP Location: Left Arm)   Pulse 92   Temp 98.7 F (37.1 C) (Oral)   Resp (!) 22   Ht 5' (1.524 m)   Wt 59 kg   SpO2 97%   BMI 25.39 kg/m   Physical Exam Vitals and nursing note reviewed.  Constitutional:      Appearance: Normal appearance.  HENT:     Head: Normocephalic and atraumatic.     Nose: Nose normal.     Mouth/Throat:     Mouth: Mucous membranes are moist.  Eyes:     Extraocular Movements: Extraocular movements intact.     Conjunctiva/sclera: Conjunctivae normal.  Cardiovascular:     Rate and Rhythm: Normal rate.  Pulmonary:     Effort: Pulmonary effort is normal.     Breath sounds: Normal breath sounds.  Abdominal:     General: Abdomen is flat.      Palpations: Abdomen is soft.     Tenderness: There is no abdominal tenderness.  Musculoskeletal:        General: Swelling and tenderness (R wrist) present. Normal range of motion.     Cervical back: Neck supple.  Skin:    General: Skin is warm and dry.     Findings: Bruising (R wrist) present.  Neurological:     General: No focal deficit present.     Mental Status: She is alert. Mental status is at baseline. She is disoriented.  Psychiatric:        Mood and Affect: Mood normal.     ED Results / Procedures / Treatments   Labs (all labs ordered are listed, but only abnormal results are displayed) Labs Reviewed - No data to display  EKG None  Radiology DG Wrist Complete Right  Result Date: 02/05/2021 CLINICAL DATA:  fall, pain/swelling EXAM: RIGHT WRIST - COMPLETE 3+ VIEW COMPARISON:  Left hand radiograph report from August 2009, images not retrievable at the time of exam FINDINGS: There is a comminuted, intra-articular fracture of the distal radius with impaction and 3-4 mm articular surface depression. There is loss of the volar tilt with dorsal angulation. There is widening of the scapholunate interval. There is involvement with the radiocarpal joint and distal radioulnar joint. Osteopenia. Probable ulnar styloid injury but no discrete fracture seen. There is moderate triscaphe and first carpometacarpal joint osteoarthritis. Chondrocalcinosis of the TFCC. Adjacent soft tissue swelling. Vascular calcifications. IMPRESSION: Comminuted intra-articular fracture of the distal radius with impaction and loss of the volar tilt. Widened scapholunate interval suggestive of scapholunate ligament tear. Electronically Signed   By: Maurine Simmering   On: 02/05/2021 10:01   CT Head Wo Contrast  Result Date: 02/05/2021 CLINICAL DATA:  Neck trauma EXAM: CT HEAD WITHOUT CONTRAST CT CERVICAL SPINE WITHOUT CONTRAST TECHNIQUE: Multidetector CT imaging of the head and cervical spine was performed following the  standard protocol without intravenous contrast. Multiplanar CT image reconstructions of the cervical spine were also generated. COMPARISON:  Head CT 05/12/2013 FINDINGS: CT HEAD FINDINGS Brain: No evidence of acute infarction, hemorrhage, hydrocephalus, extra-axial collection or mass lesion/mass effect. Confluent chronic small vessel ischemia in the deep white matter, progressed. Multiple small remote bilateral cerebellar infarcts. Brain atrophy which has clearly progressed from 2014. Vascular: No hyperdense vessel or unexpected calcification. Skull: Normal. Negative for fracture or focal lesion. Sinuses/Orbits: Bilateral cataract resection.  No visible injury CT CERVICAL SPINE FINDINGS Alignment: Degenerative  reversal of cervical lordosis. Anterolisthesis at C3-4, C4-5, and C7-T1. The upper 2 of these levels are fused. Skull base and vertebrae: No evidence of fracture or bone lesion. Soft tissues and spinal canal: No prevertebral fluid or swelling. No visible canal hematoma. Disc levels: Vertebral ankylosis from C3-C6. Diffuse degenerative disc narrowing and facet spurring. Notably advanced right C1-2 facet arthritis. Upper chest: Negative IMPRESSION: 1. No evidence of acute intracranial or cervical spine injury. 2. Prominent brain atrophy and chronic small vessel ischemia. Both are progressed from a 2014 comparison. Electronically Signed   By: Monte Fantasia M.D.   On: 02/05/2021 11:07   CT Cervical Spine Wo Contrast  Result Date: 02/05/2021 CLINICAL DATA:  Neck trauma EXAM: CT HEAD WITHOUT CONTRAST CT CERVICAL SPINE WITHOUT CONTRAST TECHNIQUE: Multidetector CT imaging of the head and cervical spine was performed following the standard protocol without intravenous contrast. Multiplanar CT image reconstructions of the cervical spine were also generated. COMPARISON:  Head CT 05/12/2013 FINDINGS: CT HEAD FINDINGS Brain: No evidence of acute infarction, hemorrhage, hydrocephalus, extra-axial collection or mass  lesion/mass effect. Confluent chronic small vessel ischemia in the deep white matter, progressed. Multiple small remote bilateral cerebellar infarcts. Brain atrophy which has clearly progressed from 2014. Vascular: No hyperdense vessel or unexpected calcification. Skull: Normal. Negative for fracture or focal lesion. Sinuses/Orbits: Bilateral cataract resection.  No visible injury CT CERVICAL SPINE FINDINGS Alignment: Degenerative reversal of cervical lordosis. Anterolisthesis at C3-4, C4-5, and C7-T1. The upper 2 of these levels are fused. Skull base and vertebrae: No evidence of fracture or bone lesion. Soft tissues and spinal canal: No prevertebral fluid or swelling. No visible canal hematoma. Disc levels: Vertebral ankylosis from C3-C6. Diffuse degenerative disc narrowing and facet spurring. Notably advanced right C1-2 facet arthritis. Upper chest: Negative IMPRESSION: 1. No evidence of acute intracranial or cervical spine injury. 2. Prominent brain atrophy and chronic small vessel ischemia. Both are progressed from a 2014 comparison. Electronically Signed   By: Monte Fantasia M.D.   On: 02/05/2021 11:07    Procedures Procedures  Medications Ordered in the ED Medications - No data to display   MDM Rules/Calculators/A&P MDM  Patient with fall at SNF. Will check CT head/cspine due to unknown mechanism. Wrist xray. Vitals are unremarkable, she is at baseline. Well appearing in no distress.   ED Course  I have reviewed the triage vital signs and the nursing notes.  Pertinent labs & imaging results that were available during my care of the patient were reviewed by me and considered in my medical decision making (see chart for details).  Clinical Course as of 02/05/21 1132  Fri Feb 05, 2021  1004 Wrist xray images and results reviewed. Will order an ulnar gutter splint.  [CS]  3428 CT head/c-spine neg for injury. Discussed wrist fracture with son at bedside. Recommend close outpatient Ortho  follow up.  [CS]    Clinical Course User Index [CS] Truddie Hidden, MD    Final Clinical Impression(s) / ED Diagnoses Final diagnoses:  Fall, initial encounter  Closed fracture of right wrist, initial encounter    Rx / DC Orders ED Discharge Orders     None        Truddie Hidden, MD 02/05/21 1132

## 2021-02-05 NOTE — ED Notes (Signed)
Vital signs difficult to obtain as patient is very combative.

## 2021-02-07 ENCOUNTER — Other Ambulatory Visit: Payer: Self-pay

## 2021-02-07 ENCOUNTER — Emergency Department (HOSPITAL_COMMUNITY): Payer: Medicare Other

## 2021-02-07 ENCOUNTER — Encounter (HOSPITAL_COMMUNITY): Payer: Self-pay | Admitting: *Deleted

## 2021-02-07 ENCOUNTER — Emergency Department (HOSPITAL_COMMUNITY)
Admission: EM | Admit: 2021-02-07 | Discharge: 2021-02-08 | Disposition: A | Discharge status: Home / Self Care | Payer: Medicare Other | Attending: Emergency Medicine | Admitting: Emergency Medicine

## 2021-02-07 DIAGNOSIS — Y92129 Unspecified place in nursing home as the place of occurrence of the external cause: Secondary | ICD-10-CM | POA: Insufficient documentation

## 2021-02-07 DIAGNOSIS — I129 Hypertensive chronic kidney disease with stage 1 through stage 4 chronic kidney disease, or unspecified chronic kidney disease: Secondary | ICD-10-CM | POA: Diagnosis not present

## 2021-02-07 DIAGNOSIS — S32010A Wedge compression fracture of first lumbar vertebra, initial encounter for closed fracture: Secondary | ICD-10-CM | POA: Diagnosis not present

## 2021-02-07 DIAGNOSIS — W19XXXA Unspecified fall, initial encounter: Secondary | ICD-10-CM | POA: Diagnosis not present

## 2021-02-07 DIAGNOSIS — J449 Chronic obstructive pulmonary disease, unspecified: Secondary | ICD-10-CM | POA: Insufficient documentation

## 2021-02-07 DIAGNOSIS — S52501A Unspecified fracture of the lower end of right radius, initial encounter for closed fracture: Secondary | ICD-10-CM | POA: Insufficient documentation

## 2021-02-07 DIAGNOSIS — R52 Pain, unspecified: Secondary | ICD-10-CM

## 2021-02-07 DIAGNOSIS — S3992XA Unspecified injury of lower back, initial encounter: Secondary | ICD-10-CM | POA: Diagnosis present

## 2021-02-07 DIAGNOSIS — N183 Chronic kidney disease, stage 3 unspecified: Secondary | ICD-10-CM | POA: Insufficient documentation

## 2021-02-07 DIAGNOSIS — S62101A Fracture of unspecified carpal bone, right wrist, initial encounter for closed fracture: Secondary | ICD-10-CM

## 2021-02-07 MED ORDER — LORAZEPAM 2 MG/ML IJ SOLN
0.5000 mg | Freq: Once | INTRAMUSCULAR | Status: AC
  Administered 2021-02-07: 0.5 mg via INTRAMUSCULAR
  Filled 2021-02-07: qty 1

## 2021-02-07 MED ORDER — LORAZEPAM 2 MG/ML IJ SOLN
1.0000 mg | Freq: Once | INTRAMUSCULAR | Status: AC
  Administered 2021-02-07: 1 mg via INTRAMUSCULAR
  Filled 2021-02-07: qty 1

## 2021-02-07 MED ORDER — OXYCODONE-ACETAMINOPHEN 5-325 MG PO TABS: 1.0000 | ORAL_TABLET | Freq: Three times a day (TID) | ORAL | 0 refills | Status: DC | PRN

## 2021-02-07 NOTE — ED Notes (Signed)
Patient to CT at this time

## 2021-02-07 NOTE — ED Provider Notes (Signed)
[facility] EMERGENCY DEPARTMENT Provider Note   CSN: [id number] Arrival date & time: 02/07/21  1512     History Chief Complaint  Patient presents with   Back Pain   Hypotension    Joanna Mendez is a 87 y.o. female.  HPI  HPI will deferred due to level 5 caveat dementia  Patient with significant medical history of Alzheimer's, CKD stage III, COPD, osteoporosis, strokes presents from [place] via EMS.  HPI was collected from nursing staff from facility and EMS.  Patient presents with worsening lower back pain and right wrist pain.  Nursing staff explained since patient was discharged on 06/24 after a  fall patient has been endorsing worsening back pain and wrist pain.  Nursing staff states that they have been unable to control the patient's pain since they are not given any narcotic medication.  They also mention that patient ripped off her fiberglass splint and has been without it for last couple days.  They state that patient been acting her normal self for the last couple days, but today she just would not get out of bed due to pain.  Staff States she is at baseline patient can walk with a walker, can answer yes/no questions but gets confused.  They deny recent falls, no decreased appetite, tolerating p.o., endorsing any urinary symptoms.  After reviewing patient's chart patient was in the emergency department on 06/24 after a fall, CT head, C-spine were both negative for acute findings, x-ray of right wrist shows comminuted intra articular fracture of the distal radius with impaction and loss of the volar tilt.  Patient's was placed in a ulnar gutter and discharged home.   Past Medical History:  Diagnosis Date   Allergic rhinitis    Alzheimer disease ([place])    Anemia    Anorexia    Anxiety    Chronic kidney disease    stage III   COPD (chronic obstructive pulmonary disease) (HCC)    chronic bronchitis   Hyperlipidemia    Hypertension    Osteoarthritis     Osteoporosis    Stroke ([place]) 01/31/2013   TIA   Vitamin D deficiency     Patient Active Problem List   Diagnosis Date Noted   Weakness    Normocytic anemia 03/21/2018   Near syncope 03/21/2018   Occult GI bleeding 03/21/2018   CKD (chronic kidney disease), stage III ([place]) 03/21/2018   GI bleed 03/21/2018   Anxiety 03/21/2018   COPD (chronic obstructive pulmonary disease) ([place]) 03/21/2018   Anemia    Rectal bleeding 12/30/2013   Chest pain 11/19/2011   Acute renal failure ([place]) 11/19/2011   HTN (hypertension) 11/19/2011   Hyperlipidemia 11/19/2011   Alzheimer disease ([place]) 11/19/2011    Past Surgical History:  Procedure Laterality Date   ABDOMINAL HYSTERECTOMY     COLONOSCOPY N/A 01/15/2014   Procedure: COLONOSCOPY;  Surgeon: Daneil Dolin, MD;  [facility];  Service: Endoscopy;  Laterality: N/A;  8:30AM   KNEE SURGERY     TONSILLECTOMY       OB History   No obstetric history on file.     Family History  Problem Relation Age of Onset   Dementia Mother    Dementia Maternal Grandmother    Colon cancer Neg Hx     Social History   Tobacco Use   Smoking status: Never   Smokeless tobacco: Never  Vaping Use   Vaping Use: Never used  Substance Use Topics  Alcohol use: No   Drug use: No    Home Medications Prior to Admission medications   Medication Sig Start Date End Date Taking? Authorizing Provider  oxyCODONE-acetaminophen (PERCOCET/ROXICET) 5-325 MG tablet Take 1 tablet by mouth every 8 (eight) hours as needed for up to 4 days for severe pain. 02/07/21 02/11/21 Yes Marcello Fennel, PA-C  acetaminophen (TYLENOL) 500 MG tablet Take 500 mg by mouth 3 (three) times daily.    [provider]  ALPRAZolam Duanne Moron) 0.25 MG tablet Take 0.25 mg by mouth 2 (two) times daily.    [provider]  Calcium Citrate-Vitamin D 250-200 MG-UNIT TABS Take 1 tablet by mouth 3 (three) times daily.    [provider]  Cholecalciferol (VITAMIN  D) 2000 units CAPS Take 1 capsule by mouth daily.    [provider]  divalproex (DEPAKOTE) 125 MG DR tablet Take 125 mg by mouth 2 (two) times daily. 01/28/21   [provider]  ferrous gluconate (FERGON) 324 MG tablet Take 1 tablet (324 mg total) by mouth daily with breakfast. Patient not taking: No sig reported 03/23/18   Kathie Dike, MD  guaiFENesin (MUCINEX) 600 MG 12 hr tablet Take 600 mg by mouth every 12 (twelve) hours as needed (congestion). Patient not taking: Reported on 02/05/2021    [provider]  Melatonin 5 MG CAPS Take 5 mg by mouth at bedtime.    [provider]  mirtazapine (REMERON) 15 MG tablet Take 7.5 mg by mouth at bedtime.     [provider]  pantoprazole (PROTONIX) 40 MG tablet Take 1 tablet (40 mg total) by mouth daily. Patient not taking: Reported on 02/05/2021 03/23/18   Kathie Dike, MD  polycarbophil (FIBERCON) 625 MG tablet Take 1 tablet (625 mg total) by mouth 2 (two) times daily. 03/23/18   Kathie Dike, MD  STOOL SOFTENER 100 MG capsule TAKE 1 CAPSULE BY MOUTH TWICE DAILY. 05/21/14   Mahala Menghini, PA-C    Allergies    Penicillins  Review of Systems   Review of Systems  Unable to perform ROS: Dementia   Physical Exam Updated Vital Signs BP 139/76 (BP Location: Left Arm)   Pulse 90   Temp (!) 97.5 F (36.4 C) (Oral)   Resp 20   Ht 5' (1.524 m)   Wt 59 kg   SpO2 94%   BMI 25.39 kg/m   Physical Exam Vitals and nursing note reviewed.  Constitutional:      General: She is not in acute distress.    Appearance: She is not ill-appearing.  HENT:     Head: Normocephalic and atraumatic.     Nose: No congestion.  Eyes:     Conjunctiva/sclera: Conjunctivae normal.  Cardiovascular:     Rate and Rhythm: Normal rate and regular rhythm.     Pulses: Normal pulses.     Heart sounds: No murmur heard.   No friction rub. No gallop.  Pulmonary:     Effort: No respiratory distress.     Breath sounds: No  wheezing, rhonchi or rales.  Abdominal:     Palpations: Abdomen is soft.     Tenderness: There is no abdominal tenderness. There is no right CVA tenderness or left CVA tenderness.  Musculoskeletal:     Comments: Right wrist was visualized appears to be slightly deformed, she is able to move her fingers, wrist and elbow without difficulty.  She was tender on the distal end of her ulna and radius, there is no  gross deformities present my exam.  Neurovascular fully intact.  Unable to palpations back as she would not allow me to touch her.  Hips were palpated they are slightly tender to palpation, no internal or external rotation of the legs, no leg shortening.  Legs were nontender to palpation.  Neurovascularly intact.  Skin:    General: Skin is warm and dry.  Neurological:     Mental Status: She is alert. Mental status is at baseline.     Comments: Face was symmetric, no word slurring, patient is moving f all 4 extremities in a meaningful full way  Psychiatric:        Mood and Affect: Mood normal.    ED Results / Procedures / Treatments   Labs (all labs ordered are listed, but only abnormal results are displayed) Labs Reviewed - No data to display  EKG None  Radiology DG Wrist Complete Right  Result Date: 02/07/2021 CLINICAL DATA:  Status post fall. EXAM: RIGHT WRIST - COMPLETE 3+ VIEW COMPARISON:  February 05, 2021 FINDINGS: Acute fracture deformity is seen involving the distal right radius with extension to involve the radiocarpal articulation. This is stable in appearance when compared to the prior study. There is no evidence of dislocation. Stable degenerative changes are seen involving the right wrist. Diffuse soft tissue swelling and vascular calcification is noted. IMPRESSION: Acute fracture of the distal right radius. Electronically Signed   By: Virgina Norfolk M.D.   On: 02/07/2021 18:47   DG HIPS BILAT WITH PELVIS 3-4 VIEWS  Result Date: 02/07/2021 CLINICAL DATA:  Status post  fall. EXAM: DG HIP (WITH OR WITHOUT PELVIS) 3-4V BILAT COMPARISON:  February 16, 2017 FINDINGS: There is no evidence of hip fracture or dislocation. Degenerative changes seen involving both hips and the visualized portion of the lower lumbar spine. IMPRESSION: No acute osseous abnormality. Electronically Signed   By: Virgina Norfolk M.D.   On: 02/07/2021 18:45    Procedures Procedures   Medications Ordered in ED Medications  LORazepam (ATIVAN) injection 0.5 mg (0.5 mg Intramuscular Given 02/07/21 1725)  LORazepam (ATIVAN) injection 1 mg (1 mg Intramuscular Given 02/07/21 1826)    ED Course  I have reviewed the triage vital signs and the nursing notes.  Pertinent labs & imaging results that were available during my care of the patient were reviewed by me and considered in my medical decision making (see chart for details).    MDM Rules/Calculators/A&P                         Initial impression-patient presents with back pain and right wrist pain.  She is alert, does not appear in acute stress, vital signs reassuring.  Will obtain imaging of her back, hips, wrists and reassess.  Work-up- DG bilateral hips are negative for acute findings.  DG right wrist shows stable fracture.  Reassessment-radiology notified me that patient would not sit still for scan.  Will provide patient with IM dose of Ativan however sit still for the rest of scan.  Rule out-low suspicion for intracranial head bleed and or mass as patient is at her baseline, she had a negative CT head on the 24th, no other recent falls.  Low suspicion for CVA as there is no focal deficits present my exam.  Low suspicion for spinal cord abnormality or spinal fracture as patient is able to move all 4 extremities without difficulty, imaging pending at this time.  low suspicion for hip fracture as  there is no internal or external rotation or leg shortening present, imaging is negative for acute findings.  Low suspicion right wrist fracture has  worsened as imaging appears to be stable from prior.  Will place in a sugar-tong.  Plan-due to shift change patient was handed off to Dr. Alvino Chapel who provided HPI, current work-up, likely disposition.  Follow-up on CT imaging, if negative patient can be discharged home with a sugar-tong splint of the right wrist, follow-up with orthopedic surgery for further evaluation.  will also prescribe patient narcotics for pain relief.  Final Clinical Impression(s) / ED Diagnoses Final diagnoses:  Closed fracture of right wrist, initial encounter    Rx / DC Orders ED Discharge Orders          Ordered    oxyCODONE-acetaminophen (PERCOCET/ROXICET) 5-325 MG tablet  Every 8 hours PRN        02/07/21 1858             Marcello Fennel, PA-C 02/07/21 Otis Peak, MD 02/07/21 [phone]

## 2021-02-07 NOTE — ED Notes (Signed)
Pt transported to XRay 

## 2021-02-07 NOTE — ED Triage Notes (Signed)
Pt brought in by [facility] from [place] with c/o continued pain to back, right wrist and head after her fall last Friday. Nursing staff at facility reported to EMS that they had concerns because pt wasn't sent back with any pain medication or splint and they can't manage her pain.

## 2021-02-07 NOTE — ED Notes (Signed)
Pt refusing to allow RN to obtain V/S at this time. Pt requests to be able to rest comfortably and not be bothered.

## 2021-02-07 NOTE — Discharge Instructions (Addendum)
Use the pain medicine to help.  Follow-up with orthopedic surgery in 1 to 2 weeks.

## 2021-02-08 MED ORDER — OXYCODONE-ACETAMINOPHEN 5-325 MG PO TABS: 1.0000 | ORAL_TABLET | Freq: Three times a day (TID) | ORAL | 0 refills | Status: AC | PRN

## 2021-02-10 ENCOUNTER — Other Ambulatory Visit: Payer: Self-pay

## 2021-02-10 ENCOUNTER — Encounter: Payer: Self-pay | Admitting: Orthopedic Surgery

## 2021-02-10 ENCOUNTER — Ambulatory Visit (INDEPENDENT_AMBULATORY_CARE_PROVIDER_SITE_OTHER): Payer: Medicare Other | Admitting: Orthopedic Surgery

## 2021-02-10 VITALS — Ht 60.0 in | Wt 130.0 lb

## 2021-02-10 DIAGNOSIS — W19XXXA Unspecified fall, initial encounter: Secondary | ICD-10-CM

## 2021-02-10 DIAGNOSIS — S52571A Other intraarticular fracture of lower end of right radius, initial encounter for closed fracture: Secondary | ICD-10-CM | POA: Diagnosis not present

## 2021-02-10 NOTE — Patient Instructions (Signed)

## 2021-02-10 NOTE — Progress Notes (Signed)
New Patient Visit  Assessment: Joanna Mendez is a 87 y.o. female with the following: Right distal radius fracture Chronic T11 compression fracture L2 compression fracture  Plan: Reviewed radiographs in clinic today, which demonstrates a comminuted, intra-articular fracture of the distal radius.  Patient is demented, and is tolerating the splint.  Plan to treat this nonoperatively in a cast.  Unfortunately, the splint has shifted, and I think it is reasonable to place her in a cast today.  This was completed without issues.  Regarding the compression fractures, I recommended treatment of her pain with Tylenol and tramadol as needed.  No brace is needed.  This will take 2-3 months for the pain to completely resolve.  This was discussed with the representative from the assisted living facility, and all questions were answered.  Follow-up in 2 weeks for repeat x-rays and a new cast.  Cast application - right short arm cast   Verbal consent was obtained and the correct extremity was identified. A well padded, appropriately molded short arm cast was applied to the right arm Fingers remained warm and well perfused.   There were no sharp edges Patient tolerated the procedure well Cast care instructions were provided     Follow-up: Return in about 2 weeks (around 02/24/2021).  Subjective:  Chief Complaint  Patient presents with   Fracture    Rt wrist DOI 02/05/21    History of Present Illness: Joanna Mendez is a 87 y.o. female who presents to clinic today for evaluation of right wrist pain.  She sustained a fall less than a week ago, and presented to the emergency department.  Initial imaging was negative.  She continued to have pain, return to the emergency department a couple of days later.  X-rays and CT scans at that time demonstrated multiple injuries.  She sustained a right distal radius fracture, which has been splinted.  She has had multiple compression fractures.  She continues to  have pain primarily in the back.  No issues with the splint.  Her right wrist is not painful at this time.   Review of Systems: No fevers or chills No numbness or tingling No chest pain No shortness of breath No bowel or bladder dysfunction No GI distress No headaches   Medical History:  Past Medical History:  Diagnosis Date   Allergic rhinitis    Alzheimer disease ([place])    Anemia    Anorexia    Anxiety    Chronic kidney disease    stage III   COPD (chronic obstructive pulmonary disease) (HCC)    chronic bronchitis   Hyperlipidemia    Hypertension    Osteoarthritis    Osteoporosis    Stroke ([place]) 01/31/2013   TIA   Vitamin D deficiency     Past Surgical History:  Procedure Laterality Date   ABDOMINAL HYSTERECTOMY     COLONOSCOPY N/A 01/15/2014   Procedure: COLONOSCOPY;  Surgeon: Daneil Dolin, MD;  [facility];  Service: Endoscopy;  Laterality: N/A;  8:30AM   KNEE SURGERY     TONSILLECTOMY      Family History  Problem Relation Age of Onset   Dementia Mother    Dementia Maternal Grandmother    Colon cancer Neg Hx    Social History   Tobacco Use   Smoking status: Never   Smokeless tobacco: Never  Vaping Use   Vaping Use: Never used  Substance Use Topics   Alcohol use: No   Drug use: No  Allergies  Allergen Reactions   Penicillins     Unknown-not listed on current MAR    Current Meds  Medication Sig   acetaminophen (TYLENOL) 500 MG tablet Take 500 mg by mouth 3 (three) times daily.   ALPRAZolam (XANAX) 0.25 MG tablet Take 0.25 mg by mouth 2 (two) times daily.   Calcium Citrate-Vitamin D 250-200 MG-UNIT TABS Take 1 tablet by mouth 3 (three) times daily.   Cholecalciferol (VITAMIN D) 2000 units CAPS Take 1 capsule by mouth daily.   divalproex (DEPAKOTE) 125 MG DR tablet Take 125 mg by mouth 2 (two) times daily.   mirtazapine (REMERON) 30 MG tablet Take 30 mg by mouth at bedtime.   oxyCODONE-acetaminophen (PERCOCET/ROXICET) 5-325 MG  tablet Take 1 tablet by mouth every 8 (eight) hours as needed for up to 4 days for severe pain.   pantoprazole (PROTONIX) 40 MG tablet Take 1 tablet (40 mg total) by mouth daily.   polycarbophil (FIBERCON) 625 MG tablet Take 1 tablet (625 mg total) by mouth 2 (two) times daily.   STOOL SOFTENER 100 MG capsule TAKE 1 CAPSULE BY MOUTH TWICE DAILY.    Objective: Ht 5' (1.524 m)   Wt 130 lb (59 kg)   BMI 25.39 kg/m   Physical Exam:  General: Elderly female., Seated in a wheelchair., and Demented, does not answer questions appropriately.  Gait: Unable to ambulate.  Evaluation of the right wrist demonstrates some swelling and bruising around the wrist.  No skin breakdown of the elbow from the splint.  Fingers are warm and well-perfused.  She is able to wiggle her fingers.  She does have some tenderness to palpation of the lower back.  Active motion intact in the TA/EHL bilaterally.  IMAGING: I personally reviewed images previously obtained from the ED  Trays of the right wrist from the emergency department demonstrates an intra-articular, and displaced comminuted fracture of the distal radius.  Acceptable alignment within the splint.  CT scans of the thoracic and lumbar spine demonstrates a chronic T11 compression fracture, as well as an acute fracture of the L2 vertebral body.  There are also some chronic deformities noted within the lumbar spine   New Medications:  No orders of the defined types were placed in this encounter.     Mordecai Rasmussen, MD  02/10/2021 12:06 PM

## 2021-02-24 ENCOUNTER — Encounter: Payer: Self-pay | Admitting: Orthopedic Surgery

## 2021-02-24 ENCOUNTER — Ambulatory Visit: Payer: Medicare Other

## 2021-02-24 ENCOUNTER — Other Ambulatory Visit: Payer: Self-pay

## 2021-02-24 ENCOUNTER — Ambulatory Visit (INDEPENDENT_AMBULATORY_CARE_PROVIDER_SITE_OTHER): Payer: Medicare Other | Admitting: Orthopedic Surgery

## 2021-02-24 DIAGNOSIS — S52571D Other intraarticular fracture of lower end of right radius, subsequent encounter for closed fracture with routine healing: Secondary | ICD-10-CM

## 2021-02-24 NOTE — Progress Notes (Signed)
Orthopaedic Clinic Return  Assessment: Joanna Mendez is a 87 y.o. female with the following: Right distal radius fracture; continue nonoperative management  Plan: Joanna Mendez sustained an intra-articular distal radius fracture, with slight interval worsening of alignment.  However, the risk of surgery far outweighs the benefit.  On physical exam today, she does allow me to manipulate the wrist with minimal discomfort.  We will plan to continue treatment with a cast.  At the next visit, we may consider transition to a brace.  Follow-up in 2 weeks.  Cast application - right short arm cast   Verbal consent was obtained and the correct extremity was identified. A well padded, appropriately molded short arm cast was applied to the right arm Fingers remained warm and well perfused.   There were no sharp edges Patient tolerated the procedure well Cast care instructions were provided    Follow-up: Return in about 2 weeks (around 03/10/2021).   Subjective:  Chief Complaint  Patient presents with   Wrist Injury    Xray out of cast    History of Present Illness: Joanna Mendez is a 87 y.o. female who returns to clinic for repeat evaluation of her right wrist.  She fell, and sustained a distal radius fracture a little over 2 weeks ago.  She was seen in clinic, and casted.  She has done well with the cast.  No issues.  Her pain is controlled.  Review of Systems: No fevers or chills No shortness of breath  Objective: There were no vitals taken for this visit.  Physical Exam:  Elderly female.  Seated in a wheelchair.  Demented.  Evaluation of the right wrist demonstrates minimal swelling.  There is an obvious deformity, with radial deviation of the wrist.  Ulnar styloid is palpable.  She tolerates ulnar deviation with minimal discomfort.  She also tolerates gentle range of motion of the wrist.  Fingers are warm and well-perfused.  IMAGING: I personally ordered and reviewed the following  images:  X-ray of the right wrist was obtained in clinic today, and demonstrates an intra-articular fracture.  There is been some impaction, with a volar fragment, as well as a dorsal fragment which have split.  She is ulnar positive.  There is increased SL interval widening.  Impression: Right distal radius fracture, with intra-articular involvement; acceptable alignment in an elderly patient.  Mordecai Rasmussen, MD 02/24/2021 10:15 AM

## 2021-02-24 NOTE — Patient Instructions (Signed)

## 2021-03-10 ENCOUNTER — Encounter: Payer: Self-pay | Admitting: Orthopedic Surgery

## 2021-03-10 ENCOUNTER — Other Ambulatory Visit: Payer: Self-pay

## 2021-03-10 ENCOUNTER — Ambulatory Visit (INDEPENDENT_AMBULATORY_CARE_PROVIDER_SITE_OTHER): Payer: Medicare Other | Admitting: Orthopedic Surgery

## 2021-03-10 ENCOUNTER — Ambulatory Visit: Payer: Medicare Other

## 2021-03-10 VITALS — Ht 60.0 in | Wt 130.0 lb

## 2021-03-10 DIAGNOSIS — S52571D Other intraarticular fracture of lower end of right radius, subsequent encounter for closed fracture with routine healing: Secondary | ICD-10-CM

## 2021-03-11 ENCOUNTER — Encounter: Payer: Self-pay | Admitting: Orthopedic Surgery

## 2021-03-11 NOTE — Progress Notes (Signed)
Orthopaedic Clinic Return  Assessment: Joanna Mendez is a 87 y.o. female with the following: Right distal radius fracture; continue nonoperative management  Plan: Repeat radiographs are stable, compared to previous.  She has a significant injury to the right wrist.  However, upon removal of the cast, she appears comfortable.  She is tolerating gentle range of motion.  No obvious discomfort.  As result, we will transition her to a removable brace.  She should wear this at all times for at least the next 2 weeks, and anticipate that she will gradually remove it and start using her wrist more.  We will see her back in clinic in approximately 4 weeks for repeat evaluation.  If she is doing very well at that time, the appointment can be scheduled.   Follow-up: Return in about 4 weeks (around 04/07/2021).   Subjective:  Chief Complaint  Patient presents with   Fracture    Rt wrist DOI 02/05/21    History of Present Illness: Joanna Mendez is a 87 y.o. female who returns to clinic for repeat evaluation of her right wrist.  She sustained a fall approximately 1 month ago.  She is tolerated the cast well.  Her pain is controlled.  She is demented, but a worker from her facility states that she has done very well.  No additional complaints at this time.  Review of Systems: No fevers or chills No shortness of breath  Objective: Ht 5' (1.524 m)   Wt 130 lb (59 kg)   BMI 25.39 kg/m   Physical Exam:  Elderly female.  Seated in a wheelchair.  Demented.  No swelling of the right wrist.  There is no obvious deformity.  She tolerates gentle range of motion of the wrist.  Minimal tenderness to palpation.  Fingers are warm and well-perfused.  She responds light touch in all fingers.  IMAGING: I personally ordered and reviewed the following images:   X-ray of the right wrist was obtained in clinic today compared to previous x-rays.  There is intra-articular involvement, with impaction of the  carpal bones into the distal radius.  She is ulnar positive.  There is increased SL interval widening.  No interval displacement compared to previous x-rays.  Impression: Right distal radius fracture with intra-articular involvement and SL interval widening, stable compared to previous x-rays.  Mordecai Rasmussen, MD 03/11/2021 7:12 AM

## 2021-04-07 ENCOUNTER — Ambulatory Visit: Payer: Medicare Other | Admitting: Orthopedic Surgery

## 2022-09-27 ENCOUNTER — Other Ambulatory Visit: Payer: Self-pay

## 2022-09-27 ENCOUNTER — Encounter (HOSPITAL_COMMUNITY): Payer: Self-pay | Admitting: Emergency Medicine

## 2022-09-27 ENCOUNTER — Observation Stay (HOSPITAL_COMMUNITY)
Admission: EM | Admit: 2022-09-27 | Discharge: 2022-09-29 | Disposition: A | Discharge status: Home / Self Care | Payer: Medicare Other | Attending: Internal Medicine | Admitting: Internal Medicine

## 2022-09-27 DIAGNOSIS — I129 Hypertensive chronic kidney disease with stage 1 through stage 4 chronic kidney disease, or unspecified chronic kidney disease: Secondary | ICD-10-CM | POA: Diagnosis not present

## 2022-09-27 DIAGNOSIS — F419 Anxiety disorder, unspecified: Secondary | ICD-10-CM | POA: Diagnosis present

## 2022-09-27 DIAGNOSIS — K921 Melena: Secondary | ICD-10-CM

## 2022-09-27 DIAGNOSIS — N183 Chronic kidney disease, stage 3 unspecified: Secondary | ICD-10-CM | POA: Diagnosis present

## 2022-09-27 DIAGNOSIS — J449 Chronic obstructive pulmonary disease, unspecified: Secondary | ICD-10-CM | POA: Insufficient documentation

## 2022-09-27 DIAGNOSIS — Z8673 Personal history of transient ischemic attack (TIA), and cerebral infarction without residual deficits: Secondary | ICD-10-CM | POA: Diagnosis not present

## 2022-09-27 DIAGNOSIS — K922 Gastrointestinal hemorrhage, unspecified: Secondary | ICD-10-CM | POA: Diagnosis present

## 2022-09-27 DIAGNOSIS — F0280 Dementia in other diseases classified elsewhere without behavioral disturbance: Secondary | ICD-10-CM | POA: Diagnosis present

## 2022-09-27 DIAGNOSIS — Z79899 Other long term (current) drug therapy: Secondary | ICD-10-CM | POA: Insufficient documentation

## 2022-09-27 DIAGNOSIS — N1832 Chronic kidney disease, stage 3b: Secondary | ICD-10-CM

## 2022-09-27 DIAGNOSIS — G309 Alzheimer's disease, unspecified: Secondary | ICD-10-CM | POA: Diagnosis not present

## 2022-09-27 LAB — IRON AND TIBC
Iron: 37 ug/dL (ref 28–170)
Saturation Ratios: 12 % (ref 10.4–31.8)
TIBC: 314 ug/dL (ref 250–450)
UIBC: 277 ug/dL

## 2022-09-27 LAB — CBC WITH DIFFERENTIAL/PLATELET
Abs Immature Granulocytes: 0.02 10*3/uL (ref 0.00–0.07)
Basophils Absolute: 0 10*3/uL (ref 0.0–0.1)
Basophils Relative: 1 %
Eosinophils Absolute: 0.2 10*3/uL (ref 0.0–0.5)
Eosinophils Relative: 2 %
HCT: 37.9 % (ref 36.0–46.0)
Hemoglobin: 11.8 g/dL — ABNORMAL LOW (ref 12.0–15.0)
Immature Granulocytes: 0 %
Lymphocytes Relative: 19 %
Lymphs Abs: 1.7 10*3/uL (ref 0.7–4.0)
MCH: 31.5 pg (ref 26.0–34.0)
MCHC: 31.1 g/dL (ref 30.0–36.0)
MCV: 101.1 fL — ABNORMAL HIGH (ref 80.0–100.0)
Monocytes Absolute: 0.8 10*3/uL (ref 0.1–1.0)
Monocytes Relative: 9 %
Neutro Abs: 6.1 10*3/uL (ref 1.7–7.7)
Neutrophils Relative %: 69 %
Platelets: 263 10*3/uL (ref 150–400)
RBC: 3.75 MIL/uL — ABNORMAL LOW (ref 3.87–5.11)
RDW: 14.2 % (ref 11.5–15.5)
WBC: 8.8 10*3/uL (ref 4.0–10.5)
nRBC: 0 % (ref 0.0–0.2)

## 2022-09-27 LAB — COMPREHENSIVE METABOLIC PANEL
ALT: 13 U/L (ref 0–44)
AST: 24 U/L (ref 15–41)
Albumin: 3.3 g/dL — ABNORMAL LOW (ref 3.5–5.0)
Alkaline Phosphatase: 56 U/L (ref 38–126)
Anion gap: 10 (ref 5–15)
BUN: 37 mg/dL — ABNORMAL HIGH (ref 8–23)
CO2: 25 mmol/L (ref 22–32)
Calcium: 8.8 mg/dL — ABNORMAL LOW (ref 8.9–10.3)
Chloride: 106 mmol/L (ref 98–111)
Creatinine, Ser: 1.21 mg/dL — ABNORMAL HIGH (ref 0.44–1.00)
GFR, Estimated: 41 mL/min — ABNORMAL LOW (ref >60)
Glucose, Bld: 104 mg/dL — ABNORMAL HIGH (ref 70–99)
Potassium: 4.8 mmol/L (ref 3.5–5.1)
Sodium: 141 mmol/L (ref 135–145)
Total Bilirubin: 0.4 mg/dL (ref 0.3–1.2)
Total Protein: 7.6 g/dL (ref 6.5–8.1)

## 2022-09-27 LAB — VITAMIN B12: Vitamin B-12: 738 pg/mL (ref 180–914)

## 2022-09-27 LAB — RETICULOCYTES
Immature Retic Fract: 11.8 % (ref 2.3–15.9)
RBC.: 3.69 MIL/uL — ABNORMAL LOW (ref 3.87–5.11)
Retic Count, Absolute: 49.8 10*3/uL (ref 19.0–186.0)
Retic Ct Pct: 1.4 % (ref 0.4–3.1)

## 2022-09-27 LAB — POC OCCULT BLOOD, ED: Fecal Occult Bld: POSITIVE — AB

## 2022-09-27 LAB — FOLATE: Folate: 7 ng/mL (ref >5.9)

## 2022-09-27 LAB — TYPE AND SCREEN
ABO/RH(D): O POS
Antibody Screen: NEGATIVE

## 2022-09-27 LAB — FERRITIN: Ferritin: 63 ng/mL (ref 11–307)

## 2022-09-27 MED ORDER — LORAZEPAM 2 MG/ML IJ SOLN: 1.0000 mg | Freq: Four times a day (QID) | INTRAMUSCULAR | Status: DC | PRN

## 2022-09-27 MED ORDER — ALPRAZOLAM 0.25 MG PO TABS
0.2500 mg | ORAL_TABLET | Freq: Two times a day (BID) | ORAL | Status: DC
  Administered 2022-09-28 – 2022-09-29 (×3): 0.25 mg via ORAL
  Filled 2022-09-27 (×4): qty 1

## 2022-09-27 MED ORDER — MIRTAZAPINE 30 MG PO TABS
30.0000 mg | ORAL_TABLET | Freq: Every day | ORAL | Status: DC
  Administered 2022-09-28: 30 mg via ORAL
  Filled 2022-09-27 (×2): qty 1

## 2022-09-27 MED ORDER — ACETAMINOPHEN 650 MG RE SUPP: 650.0000 mg | Freq: Four times a day (QID) | RECTAL | Status: DC | PRN

## 2022-09-27 MED ORDER — PANTOPRAZOLE SODIUM 40 MG IV SOLR
40.0000 mg | INTRAVENOUS | Status: AC
  Administered 2022-09-27: 40 mg via INTRAVENOUS
  Filled 2022-09-27: qty 10

## 2022-09-27 MED ORDER — MORPHINE SULFATE (PF) 2 MG/ML IV SOLN: 2.0000 mg | INTRAVENOUS | Status: DC | PRN

## 2022-09-27 MED ORDER — PANTOPRAZOLE SODIUM 40 MG IV SOLR
40.0000 mg | Freq: Two times a day (BID) | INTRAVENOUS | Status: DC
  Administered 2022-09-28 – 2022-09-29 (×2): 40 mg via INTRAVENOUS
  Filled 2022-09-27 (×2): qty 10

## 2022-09-27 MED ORDER — OXYCODONE HCL 5 MG PO TABS: 5.0000 mg | ORAL_TABLET | ORAL | Status: DC | PRN

## 2022-09-27 MED ORDER — ACETAMINOPHEN 325 MG PO TABS: 650.0000 mg | ORAL_TABLET | Freq: Four times a day (QID) | ORAL | Status: DC | PRN

## 2022-09-27 MED ORDER — DIVALPROEX SODIUM 125 MG PO DR TAB
125.0000 mg | DELAYED_RELEASE_TABLET | Freq: Two times a day (BID) | ORAL | Status: DC
  Administered 2022-09-28 – 2022-09-29 (×2): 125 mg via ORAL
  Filled 2022-09-27 (×6): qty 1

## 2022-09-27 NOTE — Assessment & Plan Note (Signed)
-   Stable and at baseline -Maintain adequate hydration and continue minimizing nephrotoxic agents.

## 2022-09-27 NOTE — Assessment & Plan Note (Signed)
-   Continue Depakote -Continue supportive care and constant reorientation

## 2022-09-27 NOTE — H&P (Signed)
History and Physical    PatientROAA ATTIG [id number] DOB: Jan 25, 1924 DOA: 09/27/2022 DOS: the patient was seen and examined on 09/27/2022 PCP: Caryl Bis, MD  Patient coming from: SNF  Chief Complaint:  Chief Complaint  Patient presents with   GI Bleeding   HPI: Joanna Mendez is a 89 y.o. female with medical history significant of GERD, diverticulosis, Alzheimer's disease, anxiety, COPD, hyperlipidemia, hypertension, stroke, and more presents to the emergency department with a chief complaint of GI bleed.  Patient is not able to provide any history.  When asked if she can tell you her name she replies no, and gives the same answer for where she is and what year it is.  She does report that she is not in any pain at this time.  It is reported that staff at her SNF saw blood in her brief, so they sent her to the ED.  The ED provider reports that on rectal exam she had dark red blood and some bright red blood mixed in with stool.  There were no hemorrhoids or fissures.  She does not seem to have abdominal pain if she does not react to palpation.  She was given Protonix in the ED and admission was requested for further management of rectal bleed.  Attempts were made to contact family who is not answering the phone.  Patient is not able to provide any more history secondary to her Alzheimer's disease. Review of Systems: Unable to review all systems due to lack of cooperation from patient. Past Medical History:  Diagnosis Date   Allergic rhinitis    Alzheimer disease ([place])    Anemia    Anorexia    Anxiety    Chronic kidney disease    stage III   COPD (chronic obstructive pulmonary disease) (HCC)    chronic bronchitis   Hyperlipidemia    Hypertension    Osteoarthritis    Osteoporosis    Stroke ([place]) 01/31/2013   TIA   Vitamin D deficiency    Past Surgical History:  Procedure Laterality Date   ABDOMINAL HYSTERECTOMY     COLONOSCOPY N/A 01/15/2014   Procedure: COLONOSCOPY;   Surgeon: Daneil Dolin, MD;  Location[facility];  Service: Endoscopy;  Laterality: N/A;  8:30AM   KNEE SURGERY     TONSILLECTOMY     Social History:  reports that she has never smoked. She has never used smokeless tobacco. She reports that she does not drink alcohol and does not use drugs.  Allergies  Allergen Reactions   Penicillins     Unknown-not listed on current MAR    Family History  Problem Relation Age of Onset   Dementia Mother    Dementia Maternal Grandmother    Colon cancer Neg Hx     Prior to Admission medications   Medication Sig Start Date End Date Taking? Authorizing Provider  acetaminophen (TYLENOL) 500 MG tablet Take 500 mg by mouth 3 (three) times daily.    [provider]  ALPRAZolam Duanne Moron) 0.25 MG tablet Take 0.25 mg by mouth 2 (two) times daily.    [provider]  Calcium Citrate-Vitamin D 250-200 MG-UNIT TABS Take 1 tablet by mouth 3 (three) times daily.    [provider]  Cholecalciferol (VITAMIN D) 2000 units CAPS Take 1 capsule by mouth daily.    [provider]  divalproex (DEPAKOTE) 125 MG DR tablet Take 125 mg by mouth 2 (two) times daily. 01/28/21   [provider]  mirtazapine (REMERON) 30 MG tablet Take 30 mg by mouth at bedtime. 01/28/21   [provider]  pantoprazole (PROTONIX) 40 MG tablet Take 1 tablet (40 mg total) by mouth daily. 03/23/18   Kathie Dike, MD  polycarbophil (FIBERCON) 625 MG tablet Take 1 tablet (625 mg total) by mouth 2 (two) times daily. 03/23/18   Kathie Dike, MD  STOOL SOFTENER 100 MG capsule TAKE 1 CAPSULE BY MOUTH TWICE DAILY. 05/21/14   Mahala Menghini, PA-C  traMADol (ULTRAM) 50 MG tablet Take 50 mg by mouth 4 (four) times daily as needed. 02/07/21   [provider]    Physical Exam: Vitals:   09/27/22 1812 09/27/22 1813 09/27/22 1819 09/27/22 1900  BP: (!) 140/76   (!) 150/106  Pulse:  82    Resp:  16  16  Temp:   98 F (36.7 C)   SpO2:  96%      1.  General: Patient lying supine in bed,  no acute distress   2. Psychiatric: Alert and disoriented, mood and behavior normal for situation, cooperative with exam at this time   3. Neurologic: Speech and language are normal, face is symmetric, moves all 4 extremities voluntarily, at baseline without acute deficits on limited exam   4. HEENMT:  Head is atraumatic, normocephalic, pupils reactive to light, neck is supple, trachea is midline, mucous membranes are moist   5. Respiratory : Lungs are clear to auscultation bilaterally without wheezing, rhonchi, rales, no cyanosis, no increase in work of breathing or accessory muscle use   6. Cardiovascular : Heart rate normal, rhythm is regular, no rubs or gallops, no peripheral edema, peripheral pulses palpated   7. Gastrointestinal:  Abdomen is soft, nondistended, nontender to palpation bowel sounds active, no masses or organomegaly palpated   8. Skin:  Skin is warm, dry and intact without rashes, acute lesions, or ulcers on limited exam   9.Musculoskeletal:  No acute deformities or trauma, no asymmetry in tone, no peripheral edema, peripheral pulses palpated, no tenderness to palpation in the extremities  Data Reviewed: In the ED  Temp 98, heart rate 82-99, respiratory rate 16, blood pressure 140/76-150/106, satting at 96% on room air No leukocytosis with a white blood cell count of 8.8, hemoglobin 11.8, platelets 263 Chemistry is mostly unremarkable with an elevated BUN at 37 and elevated creatinine which seems to be at her baseline 1.21 Hemoccult positive Patient was typed and screened EKG shows a heart rate of 73, wandering baseline, QTc 418 Admission was requested for further management of GI bleed Assessment and Plan: * GI bleed - Reported blood in stool at SNF - Dark red blood and some bright red blood with no hemorrhoid or fissure noted on ER exam - FOBT positive - History of GERD and diverticulosis - History of GI  bleed in 2015 with colonoscopy at that time - Trend CBC - Consult GI - Continue to monitor  Anxiety - Continue Xanax and Remeron  CKD (chronic kidney disease), stage III (HCC) - GFR 41, creatinine 1.21 - Creatinine seems to be at baseline - Continue to monitor  Alzheimer disease ([place]) - Continue Depakote      Advance Care Planning:   Code Status: Full Code   Consults: Gastroenterology  Family Communication: Family could not be reached  Severity of Illness: The appropriate patient status for this patient is OBSERVATION. Observation status is judged to be reasonable and necessary in order to provide the required intensity of service to  ensure the patient's safety. The patient's presenting symptoms, physical exam findings, and initial radiographic and laboratory data in the context of their medical condition is felt to place them at decreased risk for further clinical deterioration. Furthermore, it is anticipated that the patient will be medically stable for discharge from the hospital within 2 midnights of admission.   Author: Rolla Plate, DO 09/27/2022 8:36 PM  For on call review [email or web address].

## 2022-09-27 NOTE — ED Notes (Signed)
Pt in mitts, had removed left mitt and pulled bp cuff off her arm.

## 2022-09-27 NOTE — ED Triage Notes (Signed)
Pt sent from [facility] with reports of bright red blood in her stool. VSS.

## 2022-09-27 NOTE — Assessment & Plan Note (Signed)
-   Continue Xanax and Remeron -Overall stable mood.

## 2022-09-27 NOTE — ED Provider Notes (Signed)
[facility] Provider Note   CSN: [id number] Arrival date & time: 09/27/22  1754     History  Chief Complaint  Patient presents with   GI Bleeding    Joanna Mendez is a 89 y.o. female.  HPI   This patient is a 62 female, she has a history of dementia, she is coming from [facility] in [place] with a complaint of a possible GI bleed.  This patient is not able to answer any questions, reportedly the staff at the facility saw blood when they were changing her diaper.  This patient has no complaints, she does not know why she is here, she cannot tell me her name or her birthdate.  According to the medication administration record that accompanies the patient she is not on any anticoagulants or antiplatelet agents.  I have reviewed the medical record and her last colonoscopy was in June 2015.  During that colonoscopy there was found to be a polyp that was removed, there was an area of rectal ulceration, she had "solitary rectal ulcer syndrome" she also had seen colonic diverticulosis.  Home Medications Prior to Admission medications   Medication Sig Start Date End Date Taking? Authorizing Provider  acetaminophen (TYLENOL) 500 MG tablet Take 500 mg by mouth 3 (three) times daily.    [provider]  ALPRAZolam Duanne Moron) 0.25 MG tablet Take 0.25 mg by mouth 2 (two) times daily.    [provider]  Calcium Citrate-Vitamin D 250-200 MG-UNIT TABS Take 1 tablet by mouth 3 (three) times daily.    [provider]  Cholecalciferol (VITAMIN D) 2000 units CAPS Take 1 capsule by mouth daily.    [provider]  divalproex (DEPAKOTE) 125 MG DR tablet Take 125 mg by mouth 2 (two) times daily. 01/28/21   [provider]  mirtazapine (REMERON) 30 MG tablet Take 30 mg by mouth at bedtime. 01/28/21   [provider]  pantoprazole (PROTONIX) 40 MG tablet Take 1 tablet (40 mg total) by mouth daily.  03/23/18   Kathie Dike, MD  polycarbophil (FIBERCON) 625 MG tablet Take 1 tablet (625 mg total) by mouth 2 (two) times daily. 03/23/18   Kathie Dike, MD  STOOL SOFTENER 100 MG capsule TAKE 1 CAPSULE BY MOUTH TWICE DAILY. 05/21/14   Mahala Menghini, PA-C  traMADol (ULTRAM) 50 MG tablet Take 50 mg by mouth 4 (four) times daily as needed. 02/07/21   [provider]      Allergies    Penicillins    Review of Systems   Review of Systems  All other systems reviewed and are negative.   Physical Exam Updated Vital Signs BP (!) 140/76   Pulse 82   Temp 98 F (36.7 C)   Resp 16   SpO2 96%  Physical Exam Vitals and nursing note reviewed.  Constitutional:      General: She is not in acute distress.    Appearance: She is well-developed.  HENT:     Head: Normocephalic and atraumatic.     Mouth/Throat:     Pharynx: No oropharyngeal exudate.  Eyes:     General: No scleral icterus.       Right eye: No discharge.        Left eye: No discharge.     Conjunctiva/sclera: Conjunctivae normal.     Pupils: Pupils are equal, round, and reactive to light.  Neck:     Thyroid: No thyromegaly.  Vascular: No JVD.  Cardiovascular:     Rate and Rhythm: Normal rate. Rhythm irregular.     Heart sounds: Normal heart sounds. No murmur heard.    No friction rub. No gallop.  Pulmonary:     Effort: Pulmonary effort is normal. No respiratory distress.     Breath sounds: Normal breath sounds. No wheezing or rales.  Abdominal:     General: Bowel sounds are normal. There is no distension.     Palpations: Abdomen is soft. There is no mass.     Tenderness: There is no abdominal tenderness.  Genitourinary:    Comments: Normal-appearing anal structures, nontender rectal exam with chaperone present, there is reddish colored stool in the rectal vault, no bleeding hemorrhoids, no fissures Musculoskeletal:        General: No tenderness. Normal range of motion.     Cervical back: Normal range of  motion and neck supple.     Right lower leg: No edema.     Left lower leg: No edema.  Lymphadenopathy:     Cervical: No cervical adenopathy.  Skin:    General: Skin is warm and dry.     Findings: No erythema or rash.  Neurological:     Mental Status: She is alert.     Coordination: Coordination normal.  Psychiatric:        Behavior: Behavior normal.     ED Results / Procedures / Treatments   Labs (all labs ordered are listed, but only abnormal results are displayed) Labs Reviewed  VITAMIN B12  FOLATE  IRON AND TIBC  FERRITIN  RETICULOCYTES  COMPREHENSIVE METABOLIC PANEL  CBC WITH DIFFERENTIAL/PLATELET  POC OCCULT BLOOD, ED  TYPE AND SCREEN    EKG EKG Interpretation  Date/Time:  Tuesday September 27 2022 18:11:19 EST Ventricular Rate:  73 PR Interval:  59 QRS Duration: 89 QT Interval:  379 QTC Calculation: 418 R Axis:   18 Text Interpretation: Wandering atrial pacemaker Multiple premature complexes, vent & supraven Borderline repolarization abnormality Confirmed by Noemi Chapel [phone]) on 09/27/2022 6:14:40 PM  Radiology No results found.  Procedures Procedures    Medications Ordered in ED Medications  pantoprazole (PROTONIX) injection 40 mg (has no administration in time range)    ED Course/ Medical Decision Making/ A&P                             Medical Decision Making Amount and/or Complexity of Data Reviewed Labs: ordered.  Risk Prescription drug management. Decision regarding hospitalization.   This patient presents to the ED for concern of GI bleed, this involves an extensive number of treatment options, and is a complaint that carries with it a high risk of complications and morbidity.  The differential diagnosis includes diverticulosis, ulcer   Co morbidities that complicate the patient evaluation  The patient is DNR History of diverticulosis Dementia   Additional history obtained:  Additional history obtained from electronic  medical record External records from outside source obtained and reviewed including prior colonoscopy from 9 years ago   Lab Tests:  I Ordered, and personally interpreted labs.  The pertinent results include: Mild anemia    Cardiac Monitoring: / EKG:  The patient was maintained on a cardiac monitor.  I personally viewed and interpreted the cardiac monitored which showed an underlying rhythm of: Patient has history of diverticulosis, has some anemia, has rectal bleeding, vital signs stable, will be admitted for observation   Consultations Obtained:  I requested consultation with the hospitalist,  and discussed lab and imaging findings as well as pertinent plan - they recommend: They will admit   Problem List / ED Course / Critical interventions / Medication management  No indication for acute transfusion however the patient will need to be admitted and observed in the hospital I have reviewed the patients home medicines and have made adjustments as needed   Social Determinants of Health:  Dementia   Test / Admission - Considered:  Will admit         Final Clinical Impression(s) / ED Diagnoses Final diagnoses:  Gastrointestinal hemorrhage, unspecified gastrointestinal hemorrhage type    Rx / DC Orders ED Discharge Orders     None         Noemi Chapel, MD 09/27/22 2020

## 2022-09-27 NOTE — Assessment & Plan Note (Signed)
-   Reported blood in stool at SNF - Dark red blood and some bright red blood with no hemorrhoid or fissure noted on ER exam - FOBT positive - History of GERD and diverticulosis - History of GI bleed in 2015 with colonoscopy at that time - Trend CBC - Consult GI - Continue to monitor

## 2022-09-28 DIAGNOSIS — K921 Melena: Secondary | ICD-10-CM | POA: Diagnosis not present

## 2022-09-28 DIAGNOSIS — F419 Anxiety disorder, unspecified: Secondary | ICD-10-CM | POA: Diagnosis not present

## 2022-09-28 DIAGNOSIS — K5791 Diverticulosis of intestine, part unspecified, without perforation or abscess with bleeding: Secondary | ICD-10-CM

## 2022-09-28 DIAGNOSIS — K922 Gastrointestinal hemorrhage, unspecified: Secondary | ICD-10-CM | POA: Diagnosis not present

## 2022-09-28 DIAGNOSIS — N1832 Chronic kidney disease, stage 3b: Secondary | ICD-10-CM | POA: Diagnosis not present

## 2022-09-28 DIAGNOSIS — G309 Alzheimer's disease, unspecified: Secondary | ICD-10-CM | POA: Diagnosis not present

## 2022-09-28 LAB — CBC
HCT: 35 % — ABNORMAL LOW (ref 36.0–46.0)
HCT: 35.5 % — ABNORMAL LOW (ref 36.0–46.0)
HCT: 36.8 % (ref 36.0–46.0)
Hemoglobin: 11.1 g/dL — ABNORMAL LOW (ref 12.0–15.0)
Hemoglobin: 11.2 g/dL — ABNORMAL LOW (ref 12.0–15.0)
Hemoglobin: 11.6 g/dL — ABNORMAL LOW (ref 12.0–15.0)
MCH: 31.5 pg (ref 26.0–34.0)
MCH: 31.5 pg (ref 26.0–34.0)
MCH: 32.4 pg (ref 26.0–34.0)
MCHC: 31.5 g/dL (ref 30.0–36.0)
MCHC: 31.5 g/dL (ref 30.0–36.0)
MCHC: 31.7 g/dL (ref 30.0–36.0)
MCV: 100 fL (ref 80.0–100.0)
MCV: 100 fL (ref 80.0–100.0)
MCV: 102 fL — ABNORMAL HIGH (ref 80.0–100.0)
Platelets: 231 10*3/uL (ref 150–400)
Platelets: 237 10*3/uL (ref 150–400)
Platelets: 248 10*3/uL (ref 150–400)
RBC: 3.43 MIL/uL — ABNORMAL LOW (ref 3.87–5.11)
RBC: 3.55 MIL/uL — ABNORMAL LOW (ref 3.87–5.11)
RBC: 3.68 MIL/uL — ABNORMAL LOW (ref 3.87–5.11)
RDW: 13.9 % (ref 11.5–15.5)
RDW: 14 % (ref 11.5–15.5)
RDW: 14 % (ref 11.5–15.5)
WBC: 6.5 10*3/uL (ref 4.0–10.5)
WBC: 7.3 10*3/uL (ref 4.0–10.5)
WBC: 8.2 10*3/uL (ref 4.0–10.5)
nRBC: 0 % (ref 0.0–0.2)
nRBC: 0 % (ref 0.0–0.2)
nRBC: 0 % (ref 0.0–0.2)

## 2022-09-28 LAB — COMPREHENSIVE METABOLIC PANEL
ALT: 12 U/L (ref 0–44)
AST: 20 U/L (ref 15–41)
Albumin: 3 g/dL — ABNORMAL LOW (ref 3.5–5.0)
Alkaline Phosphatase: 49 U/L (ref 38–126)
Anion gap: 6 (ref 5–15)
BUN: 33 mg/dL — ABNORMAL HIGH (ref 8–23)
CO2: 24 mmol/L (ref 22–32)
Calcium: 8.7 mg/dL — ABNORMAL LOW (ref 8.9–10.3)
Chloride: 109 mmol/L (ref 98–111)
Creatinine, Ser: 1.05 mg/dL — ABNORMAL HIGH (ref 0.44–1.00)
GFR, Estimated: 49 mL/min — ABNORMAL LOW (ref >60)
Glucose, Bld: 91 mg/dL (ref 70–99)
Potassium: 4.3 mmol/L (ref 3.5–5.1)
Sodium: 139 mmol/L (ref 135–145)
Total Bilirubin: 0.5 mg/dL (ref 0.3–1.2)
Total Protein: 6.9 g/dL (ref 6.5–8.1)

## 2022-09-28 LAB — MAGNESIUM: Magnesium: 2 mg/dL (ref 1.7–2.4)

## 2022-09-28 MED ORDER — POLYETHYLENE GLYCOL 3350 17 G PO PACK: 17.0000 g | PACK | Freq: Every day | ORAL | 1 refills | Status: DC

## 2022-09-28 MED ORDER — SENNA 8.6 MG PO TABS: 1.0000 | ORAL_TABLET | Freq: Two times a day (BID) | ORAL | 0 refills | Status: AC

## 2022-09-28 NOTE — Progress Notes (Signed)
   09/28/22 1352  Vitals  Temp (!) 97.5 F (36.4 C)  Temp Source Oral  BP (!) 186/90  MAP (mmHg) 113  Pulse Rate 72  Pulse Rate Source Radial;Left  Resp 18  Level of Consciousness  Level of Consciousness Alert  MEWS COLOR  MEWS Score Color Green  Oxygen Therapy  SpO2 96 %  O2 Device Room Air  MEWS Score  MEWS Temp 0  MEWS Systolic 0  MEWS Pulse 0  MEWS RR 0  MEWS LOC 0  MEWS Score 0   MD [place] notified.

## 2022-09-28 NOTE — Consult Note (Signed)
Gastroenterology Consult   Referring Provider: No ref. provider found Primary Care Physician:  Caryl Bis, MD Primary Gastroenterologist:  not established   Patient ID: Joanna Mendez; [id number]; Mar 04, 1924   Admit date: 09/27/2022  LOS: 0 days   Date of Consultation: 09/28/2022  Reason for Consultation:  rectal bleeding   History of Present Illness   Joanna Mendez is a 89 y.o. year old female with history of alzheimers disease, anemia, anxiety, CKD stage III, COPD, HLD, HTN, OA, osteoporosis who presented to the [facility] from [facility] in [place] with c/o staff seeing blood when changing patient's brief. Patient unable to provide any history. Reddish colored stool noted on rectal exam in [facility]. GI consulted for further evaluation.  [facility] Course: Hemodynamically stable  Ferritin 63, iron 37, TIBC 314, saturation 12% BUN 37 ( appears baseline)  Hgb 11.8 (last in chart 8.1 four years ago)  FOBT positive  Consult: Patient unable to provide history due to cognitive impairment as above. No family present at bedside. She is alert and oriented to person. Denies abdominal pain, nauesa, or vomiting. She is relaxing on hospital bed, pleasant and cooperative. appears to be in no acute distress.  I did connect with the patient's son, Wille Glaser. He reports patient had an episode of rectal bleeding one time last year and was thought that she had scratched herself. He does not know any other information about what has been going on with her recently other than the facility stating she had some blood in her stool and was being taken to [facility].   Last Colonoscopy: 2015 polyp, rectal ulceration, colonic diverticulosis.    Past Medical History:  Diagnosis Date   Allergic rhinitis    Alzheimer disease ([place])    Anemia    Anorexia    Anxiety    Chronic kidney disease    stage III   COPD (chronic obstructive pulmonary disease) (HCC)    chronic bronchitis   Hyperlipidemia    Hypertension     Osteoarthritis    Osteoporosis    Stroke ([place]) 01/31/2013   TIA   Vitamin D deficiency     Past Surgical History:  Procedure Laterality Date   ABDOMINAL HYSTERECTOMY     COLONOSCOPY N/A 01/15/2014   Procedure: COLONOSCOPY;  Surgeon: Daneil Dolin, MD;  Location: [facility];  Service: Endoscopy;  Laterality: N/A;  8:30AM   KNEE SURGERY     TONSILLECTOMY      Prior to Admission medications   Medication Sig Start Date End Date Taking? Authorizing Provider  acetaminophen (TYLENOL) 500 MG tablet Take 500 mg by mouth 3 (three) times daily.   Yes [provider]  ALPRAZolam (XANAX) 0.25 MG tablet Take 0.25 mg by mouth 2 (two) times daily.   Yes [provider]  Calcium Citrate-Vitamin D 250-200 MG-UNIT TABS Take 1 tablet by mouth 3 (three) times daily.   Yes [provider]  Cholecalciferol (VITAMIN D) 2000 units CAPS Take 1 capsule by mouth daily.   Yes [provider]  divalproex (DEPAKOTE) 125 MG DR tablet Take 125 mg by mouth 2 (two) times daily. 01/28/21  Yes [provider]  mirtazapine (REMERON) 30 MG tablet Take 30 mg by mouth at bedtime. 01/28/21  Yes [provider]  polycarbophil (FIBERCON) 625 MG tablet Take 1 tablet (625 mg total) by mouth 2 (two) times daily. 03/23/18  Yes Memon, Jolaine Artist, MD  STOOL SOFTENER 100 MG capsule TAKE 1 CAPSULE BY MOUTH TWICE DAILY.  05/21/14  Yes Mahala Menghini, PA-C    Current Facility-Administered Medications  Medication Dose Route Frequency Provider Last Rate Last Admin   acetaminophen (TYLENOL) tablet 650 mg  650 mg Oral Q6H PRN Zierle-Ghosh, Asia B, DO       Or   acetaminophen (TYLENOL) suppository 650 mg  650 mg Rectal Q6H PRN Zierle-Ghosh, Asia B, DO       ALPRAZolam (XANAX) tablet 0.25 mg  0.25 mg Oral BID Zierle-Ghosh, Asia B, DO       divalproex (DEPAKOTE) DR tablet 125 mg  125 mg Oral BID Zierle-Ghosh, Asia B, DO       LORazepam (ATIVAN) injection 1 mg  1 mg Intravenous Q6H PRN  Zierle-Ghosh, Asia B, DO       mirtazapine (REMERON) tablet 30 mg  30 mg Oral QHS Zierle-Ghosh, Asia B, DO       morphine (PF) 2 MG/ML injection 2 mg  2 mg Intravenous Q2H PRN Zierle-Ghosh, Asia B, DO       oxyCODONE (Oxy IR/ROXICODONE) immediate release tablet 5 mg  5 mg Oral Q4H PRN Zierle-Ghosh, Asia B, DO       pantoprazole (PROTONIX) injection 40 mg  40 mg Intravenous Q12H Zierle-Ghosh, Asia B, DO        Allergies as of 09/27/2022 - Review Complete 09/27/2022  Allergen Reaction Noted   Penicillins  09/09/2013    Family History  Problem Relation Age of Onset   Dementia Mother    Dementia Maternal Grandmother    Colon cancer Neg Hx     Social History   Socioeconomic History   Marital status: Widowed    Spouse name: Not on file   Number of children: Not on file   Years of education: Not on file   Highest education level: Not on file  Occupational History   Not on file  Tobacco Use   Smoking status: Never   Smokeless tobacco: Never  Vaping Use   Vaping Use: Never used  Substance and Sexual Activity   Alcohol use: No   Drug use: No   Sexual activity: Not Currently  Other Topics Concern   Not on file  Social History Narrative   Not on file   Social Determinants of Health   Financial Resource Strain: Not on file  Food Insecurity: No Food Insecurity (09/27/2022)   Hunger Vital Sign    Worried About Running Out of Food in the Last Year: Never true    Ran Out of Food in the Last Year: Never true  Transportation Needs: No Transportation Needs (09/27/2022)   PRAPARE - Hydrologist (Medical): No    Lack of Transportation (Non-Medical): No  Physical Activity: Not on file  Stress: Not on file  Social Connections: Not on file  Intimate Partner Violence: Unknown (09/27/2022)   Humiliation, Afraid, Rape, and Kick questionnaire    Fear of Current or Ex-Partner: Patient refused    Emotionally Abused: Patient refused    Physically Abused: Patient  refused    Sexually Abused: Patient refused    Review of Systems   Unable to review due to patient's cognitive impairment.   Physical Exam   Vital Signs in last 24 hours: Temp:  [98 F (36.7 C)-98.1 F (36.7 C)] 98.1 F (36.7 C) (02/14 0142) Pulse Rate:  [64-87] 74 (02/14 0800) Resp:  [16-20] 17 (02/14 0800) BP: (140-160)/(55-106) 160/72 (02/14 0800) SpO2:  [93 %-96 %] 93 % (02/14 0800) Weight:  [50.2  kg] 50.2 kg (02/13 2225)   General:   Alert,  Well-developed, well-nourished, pleasant and cooperative in NAD Head:  Normocephalic and atraumatic. Eyes:  Sclera clear, no icterus.   Conjunctiva pink. Ears:  Normal auditory acuity. Mouth:  No deformity or lesions, dentition normal. Lungs:  Clear throughout to auscultation.   No wheezes, crackles, or rhonchi. No acute distress. Heart:  Regular rate and rhythm; no murmurs, clicks, rubs,  or gallops. Abdomen:  Soft, nontender and nondistended. No masses, hepatosplenomegaly or hernias noted. Normal bowel sounds, without guarding, and without rebound.   Rectal: deferred  Msk:  Symmetrical without gross deformities. Normal posture. Extremities:  Without clubbing or edema. Neurologic:  Alert and  oriented to person. Disoriented to place, time, situation.  Skin:  Intact without significant lesions or rashes. Psych:  Alert and cooperative. Normal mood and affect.  Intake/Output from previous day: 02/13 0701 - 02/14 0700 In: 0  Out: 300 [Urine:300]   Labs/Studies   Recent Labs Recent Labs    09/27/22 1820 09/28/22 0016 09/28/22 0510  WBC 8.8 8.2 6.5  HGB 11.8* 11.1* 11.2*  HCT 37.9 35.0* 35.5*  PLT 263 231 237   BMET Recent Labs    09/27/22 1820 09/28/22 0510  NA 141 139  K 4.8 4.3  CL 106 109  CO2 25 24  GLUCOSE 104* 91  BUN 37* 33*  CREATININE 1.21* 1.05*  CALCIUM 8.8* 8.7*   LFT Recent Labs    09/27/22 1820 09/28/22 0510  PROT 7.6 6.9  ALBUMIN 3.3* 3.0*  AST 24 20  ALT 13 12  ALKPHOS 56 15  BILITOT  0.4 0.5    Assessment   Joanna Mendez is a 89 y.o. year old female  with history of alzheimers disease, anemia, anxiety, CKD stage III, COPD, HLD, HTN, OA, osteoporosis who presented to the [facility] from [facility] in [place] with c/o staff seeing blood when changing patient's brief. Patient unable to provide any history. Reddish colored stool noted on rectal exam in Jo[facility]. GI consulted for further evaluation  Rectal bleeding: reported blood present in brief when staff changed patient at her nursing facility. Rectal exam in [facility] with some reddish colored stool, FOBT positive, though appears to have been positive chronically for the past 8 years in chart review. hgb stable, iron studies WNL. Patient unable to provide history due to Alzheimers. She denies abdominal pain. She does not appear to be in any pain or distress upon my abdominal exam.   No BMs today per nursing staff. History of rectal ulcers in the past. I did connect with patient's POA/Son, Wynelle Cleveland to discuss reason for admission. He is unable to provide any further information other than what the facility told him regarding some blood in patient's stool. I did discuss risks vs benefits of further investigation via colonoscopy and that at this time risks would likely outweigh the benefits and patient would likely be difficult to prep for procedure to which he agreed and prefers to hold off on any invasive procedures given her age and mental capacity.  We will continue with supportive measures, trend h&h. Will have nursing staff make [place] aware of any significant GI symptoms or GI bleeding.    Plan / Recommendations   Trend h&h, transfuse as needed for hgb 7 or below Monitor for overt GI Bleeding  3. PPI BID     09/28/2022, 9:13 AM  Gerre Ranum L. Alver Sorrow, MSN, APRN, AGNP-C Adult-Gerontology Nurse Practitioner [facility] Gastroenterology at [facility]

## 2022-09-28 NOTE — Progress Notes (Signed)
Update was given to the nurse at [facility].

## 2022-09-28 NOTE — TOC Progression Note (Signed)
Transition of Care [facility]) - Progression Note    Patient Details  Name: Joanna Mendez MRN: [id number] Date of Birth: 05-20-1924  Transition of Care [facility]) CM/SW Contact  Boneta Lucks, RN Phone Number: 09/28/2022, 4:26 PM  Clinical Narrative:   DC summary completed. CM spoke with Sharyn Lull at [facility], It is to late to admit her this evening. TOC can call Sharyn Lull in the morning and they will come to transport patient.  FL2 and DC summary faxed to [facility]. RN/MD updated.  No PT eval done. [facility] can followup up with patient's PCP.     Expected Discharge Plan: Assisted Living Barriers to Discharge: Continued Medical Work up  Expected Discharge Plan and Services       Expected Discharge Date: 09/28/22                    Social Determinants of Health (SDOH) Interventions SDOH Screenings   Food Insecurity: No Food Insecurity (09/27/2022)  Housing: Low Risk  (09/27/2022)  Transportation Needs: No Transportation Needs (09/27/2022)  Utilities: Unknown (09/27/2022)  Tobacco Use: Low Risk  (09/27/2022)    Readmission Risk Interventions     No data to display

## 2022-09-28 NOTE — Progress Notes (Signed)
DNR form from facility with patient.

## 2022-09-28 NOTE — Progress Notes (Signed)
Mobility Specialist Progress Note:   09/28/22 1428  Mobility  Activity Ambulated with assistance to bathroom;Ambulated with assistance in room  Level of Assistance Modified independent, requires aide device or extra time  Assistive Device None  Distance Ambulated (ft) 30 ft  Activity Response Tolerated well  Mobility Referral Yes  $Mobility charge 1 Mobility   Pt triggered bed alarm by getting out of bed. Responded to alarm, assisted pt from bathroom back to bed with CGA for safety. Pt used wall for support in order to ambulate back to bed. Tolerated well, asx throughout. Left pt in bed with all needs met, call bell within reach, bed alarm on.   Royetta Crochet Mobility Specialist Please contact via Solicitor or  Rehab office at [phone]

## 2022-09-28 NOTE — Progress Notes (Signed)
Reported off to [facility] LPN. No response currently about a diet order for patient if patient is not going to have a procedure. Also, not response currently about patients bp.

## 2022-09-28 NOTE — TOC Progression Note (Signed)
Transition of Care [facility]) - Progression Note    Patient Details  Name: Joanna Mendez MRN: [id number] Date of Birth: 06-15-1924  Transition of Care [facility]) CM/SW Contact  Boneta Lucks, RN Phone Number: 09/28/2022, 1:06 PM  Clinical Narrative:   Patient from [facility]. MD planning to discharge back, waiting on GI consult. FL2 started. TOC following.    Expected Discharge Plan: Assisted Living Barriers to Discharge: Continued Medical Work up  Expected Discharge Plan and Services    Return to ALF     Social Determinants of Health (SDOH) Interventions SDOH Screenings   Food Insecurity: No Food Insecurity (09/27/2022)  Housing: Low Risk  (09/27/2022)  Transportation Needs: No Transportation Needs (09/27/2022)  Utilities: Unknown (09/27/2022)  Tobacco Use: Low Risk  (09/27/2022)    Readmission Risk Interventions     No data to display

## 2022-09-28 NOTE — [place] FL2 (Signed)
[place] MEDICAID FL2 LEVEL OF CARE FORM     IDENTIFICATION  Patient Name: Joanna Mendez Birthdate: [birth year] Sex: female Admission Date (Current Location): 09/27/2022  [facility] and [place] Number:  [employer] and Address:  [facility] [address] [place]      Provider Number: [id number]  Attending Physician Name and Address:  Barton Dubois, MD  Relative Name and Phone Number:  Cody, Shave) [phone] (    Current Level of Care: Hospital Recommended Level of Care: Assisted Living Facility Prior Approval Number:    Date Approved/Denied:   PASRR Number:    Discharge Plan: Domiciliary (Rest home)    Current Diagnoses: Patient Active Problem List   Diagnosis Date Noted   Hematochezia 09/28/2022   Weakness    Normocytic anemia 03/21/2018   Near syncope 03/21/2018   Occult GI bleeding 03/21/2018   CKD (chronic kidney disease), stage III ([place]) 03/21/2018   GI bleed 03/21/2018   Anxiety 03/21/2018   COPD (chronic obstructive pulmonary disease) ([place]) 03/21/2018   Anemia    Rectal bleeding 12/30/2013   Chest pain 11/19/2011   Acute renal failure ([place]) 11/19/2011   HTN (hypertension) 11/19/2011   Hyperlipidemia 11/19/2011   Alzheimer disease ([place]) 11/19/2011    Orientation RESPIRATION BLADDER Height & Weight     Self  Normal Incontinent Weight: 50.2 kg Height:  5' (152.4 cm)  BEHAVIORAL SYMPTOMS/MOOD NEUROLOGICAL BOWEL NUTRITION STATUS      Incontinent Diet (Regular)  AMBULATORY STATUS COMMUNICATION OF NEEDS Skin   Limited Assist Verbally Bruising                       Personal Care Assistance Level of Assistance  Bathing, Feeding, Dressing Bathing Assistance: Limited assistance Feeding assistance: Limited assistance Dressing Assistance: Limited assistance     Functional Limitations Info  Sight, Hearing, Speech Sight Info: Adequate Hearing Info: Adequate Speech Info: Adequate    SPECIAL CARE FACTORS  FREQUENCY                       Contractures Contractures Info: Not present    Additional Factors Info  Code Status, Allergies, Psychotropic Code Status Info: DNR Allergies Info: Penicillins Psychotropic Info: Xanax, Remeron         Current Medications (09/28/2022):  This is the current hospital active medication list Current Facility-Administered Medications  Medication Dose Route Frequency Provider Last Rate Last Admin   acetaminophen (TYLENOL) tablet 650 mg  650 mg Oral Q6H PRN Zierle-Ghosh, Asia B, DO       Or   acetaminophen (TYLENOL) suppository 650 mg  650 mg Rectal Q6H PRN Zierle-Ghosh, Asia B, DO       ALPRAZolam (XANAX) tablet 0.25 mg  0.25 mg Oral BID Zierle-Ghosh, Asia B, DO   0.25 mg at 09/28/22 1238   divalproex (DEPAKOTE) DR tablet 125 mg  125 mg Oral BID Zierle-Ghosh, Asia B, DO       LORazepam (ATIVAN) injection 1 mg  1 mg Intravenous Q6H PRN Zierle-Ghosh, Asia B, DO       mirtazapine (REMERON) tablet 30 mg  30 mg Oral QHS Zierle-Ghosh, Asia B, DO       morphine (PF) 2 MG/ML injection 2 mg  2 mg Intravenous Q2H PRN Zierle-Ghosh, Asia B, DO       oxyCODONE (Oxy IR/ROXICODONE) immediate release tablet 5 mg  5 mg Oral Q4H PRN Zierle-Ghosh, Asia B, DO  pantoprazole (PROTONIX) injection 40 mg  40 mg Intravenous Q12H Zierle-Ghosh, Asia B, DO   40 mg at 09/28/22 1233     Discharge Medications:    Medication List       STOP taking these medications     polycarbophil 625 MG tablet Commonly known as: FIBERCON    Stool Softener 100 MG capsule Generic drug: docusate sodium           TAKE these medications     acetaminophen 500 MG tablet Commonly known as: TYLENOL Take 500 mg by mouth 3 (three) times daily.    ALPRAZolam 0.25 MG tablet Commonly known as: XANAX Take 0.25 mg by mouth 2 (two) times daily.    Calcium Citrate-Vitamin D 250-200 MG-UNIT Tabs Take 1 tablet by mouth 3 (three) times daily.    divalproex 125 MG DR tablet Commonly known  as: DEPAKOTE Take 125 mg by mouth 2 (two) times daily.    mirtazapine 30 MG tablet Commonly known as: REMERON Take 30 mg by mouth at bedtime.    polyethylene glycol 17 g packet Commonly known as: MiraLax Take 17 g by mouth daily. Hold for diarrhea; please titrate if needed to twice a day in order to achieved good BM daily    senna 8.6 MG Tabs tablet Commonly known as: SENOKOT Take 1 tablet (8.6 mg total) by mouth in the morning and at bedtime.    Vitamin D 50 MCG (2000 UT) Caps Take 1 capsule by mouth daily.       Relevant Imaging Results:  Relevant Lab Results:   Additional Information SS# [id number]  Boneta Lucks, RN

## 2022-09-28 NOTE — Discharge Summary (Signed)
Physician Discharge Summary   Patient: Joanna Mendez MRN: [id number] DOB: 15-Apr-1924  Admit date:     09/27/2022  Discharge date: 09/29/22  Discharge Physician: Barton Dubois   PCP: Caryl Bis, MD   Recommendations at discharge:  Repeat CBC to follow hemoglobin trend Continue to assess/adjust bowel regimen based on patient response to further treat and to alleviate constipation.  Discharge Diagnoses: Principal Problem:   GI bleed Active Problems:   Alzheimer disease ([place])   CKD (chronic kidney disease), stage III [facility])   Anxiety  Hospital Course: Joanna Mendez is a 89 y.o. female with medical history significant of GERD, diverticulosis, Alzheimer's disease, anxiety, COPD, hyperlipidemia, hypertension, stroke, and more presents to the emergency department with a chief complaint of GI bleed.  Patient is not able to provide any history.  When asked if she can tell you her name she replies no, and gives the same answer for where she is and what year it is.  She does report that she is not in any pain at this time.  It is reported that staff at her SNF saw blood in her brief, so they sent her to the ED.  The ED provider reports that on rectal exam she had dark red blood and some bright red blood mixed in with stool.  There were no hemorrhoids or fissures.  She does not seem to have abdominal pain if she does not react to palpation.  She was given Protonix in the ED and admission was requested for further management of rectal bleed.   Attempts were made to contact family who is not answering the phone.  Patient is not able to provide any more history secondary to her Alzheimer's disease.  Assessment and Plan: * GI bleed - Reported blood in stool  -Fecal occult blood test found to be positive -Patient presentation most likely associated with diverticular bleed versus internal hemorrhoids. -Overall asymptomatic, no nausea, no vomiting, no abdominal pain -Hemoglobin has remained stable and  in fact is trending up. -Case discussed with gastroenterology service and with patient's POA (her son Joanna Mendez), at this moment decision has been made to proceed with conservative management only and avoid any invasive/heroic evaluation. -No colonoscopy or endoscopy evaluation planned. -Bowel regimen with the use of MiraLAX, Senokot and adequate hydration recommended. -Follow-up with PCP in 2 weeks -Continue to follow hemoglobin trend.  Anxiety - Continue Xanax and Remeron -Overall stable mood.  CKD (chronic kidney disease), stage III (HCC) - Stable and at baseline -Maintain adequate hydration and continue minimizing nephrotoxic agents.  Alzheimer disease ([place]) - Continue Depakote -Continue supportive care and constant reorientation   Consultants: GI service Procedures performed: None Disposition: Return back to ALF/home. Diet recommendation: Soft/low fiber diet.  DISCHARGE MEDICATION: Allergies as of 09/28/2022       Reactions   Penicillins    Unknown-not listed on current MAR        Medication List     STOP taking these medications    polycarbophil 625 MG tablet Commonly known as: FIBERCON   Stool Softener 100 MG capsule Generic drug: docusate sodium       TAKE these medications    acetaminophen 500 MG tablet Commonly known as: TYLENOL Take 500 mg by mouth 3 (three) times daily.   ALPRAZolam 0.25 MG tablet Commonly known as: XANAX Take 0.25 mg by mouth 2 (two) times daily.   Calcium Citrate-Vitamin D 250-200 MG-UNIT Tabs Take 1 tablet by mouth 3 (three) times daily.  divalproex 125 MG DR tablet Commonly known as: DEPAKOTE Take 125 mg by mouth 2 (two) times daily.   mirtazapine 30 MG tablet Commonly known as: REMERON Take 30 mg by mouth at bedtime.   polyethylene glycol 17 g packet Commonly known as: MiraLax Take 17 g by mouth daily. Hold for diarrhea; please titrate if needed to twice a day in order to achieved good BM daily   senna 8.6 MG  Tabs tablet Commonly known as: SENOKOT Take 1 tablet (8.6 mg total) by mouth in the morning and at bedtime.   Vitamin D 50 MCG (2000 UT) Caps Take 1 capsule by mouth daily.        Follow-up Information     Caryl Bis, MD. Schedule an appointment as soon as possible for a visit in 2 week(s).   Specialty: Family Medicine Contact information: [place] [address] [phone]                Discharge Exam: Danley Danker Weights   09/27/22 2225  Weight: 50.2 kg   General exam: Hard of hearing; chronically ill in appearance in no acute distress.  No nausea, no vomiting, no abdominal pain, no chest pain or shortness of breath.  Pleasantly confused.  Able to follow simple commands intermittently. Respiratory system: Clear to auscultation. Respiratory effort normal.  No using accessory muscle.  Good saturation on room air. Cardiovascular system:Rate controlled. No rubs or gallops; no JVD. Gastrointestinal system: Abdomen is nondistended, soft and nontender. No organomegaly or masses felt. Normal bowel sounds heard. Central nervous system: Alert and oriented. No focal neurological deficits. Extremities: No cyanosis or clubbing; no edema. Skin: No petechiae. Psychiatry: Judgement and insight appear impaired secondary to dementia.   Condition at discharge: Stable and improved.  The results of significant diagnostics from this hospitalization (including imaging, microbiology, ancillary and laboratory) are listed below for reference.   Imaging Studies: No results found.  Microbiology: Results for orders placed or performed during the hospital encounter of 11/19/11  MRSA PCR Screening     Status: None   Collection Time: 11/20/11 12:51 AM   Specimen: Nasal Mucosa; Nasopharyngeal  Result Value Ref Range Status   MRSA by PCR NEGATIVE NEGATIVE Final    Comment:        The GeneXpert MRSA Assay (FDA approved for NASAL specimens only), is one component of a comprehensive  MRSA colonization surveillance program. It is not intended to diagnose MRSA infection nor to guide or monitor treatment for MRSA infections.    Labs: CBC: Recent Labs  Lab 09/27/22 1820 09/28/22 0016 09/28/22 0510 09/28/22 1250  WBC 8.8 8.2 6.5 7.3  NEUTROABS 6.1  --   --   --   HGB 11.8* 11.1* 11.2* 11.6*  HCT 37.9 35.0* 35.5* 36.8  MCV 101.1* 102.0* 100.0 100.0  PLT 263 231 237 [id number]   Basic Metabolic Panel: Recent Labs  Lab 09/27/22 1820 09/28/22 0510  NA 141 139  K 4.8 4.3  CL 106 109  CO2 25 24  GLUCOSE 104* 91  BUN 37* 33*  CREATININE 1.21* 1.05*  CALCIUM 8.8* 8.7*  MG  --  2.0   Liver Function Tests: Recent Labs  Lab 09/27/22 1820 09/28/22 0510  AST 24 20  ALT 13 12  ALKPHOS 56 49  BILITOT 0.4 0.5  PROT 7.6 6.9  ALBUMIN 3.3* 3.0*   CBG: No results for input(s): "GLUCAP" in the last 168 hours.  Discharge time spent: greater than 30 minutes.  Signed: Barton Dubois, MD [facility] 09/28/2022

## 2022-09-28 NOTE — Progress Notes (Signed)
While answering bed alarm, pt was found with mittens, catheter, and IV removed. Pt assisted back into bed, IV site assessed; IV catheter intact, bleeding controlled, gauze secured with tape. Mittens replaced, and external catheter replaced. Primary LPN notified.

## 2022-09-29 DIAGNOSIS — K922 Gastrointestinal hemorrhage, unspecified: Secondary | ICD-10-CM | POA: Diagnosis not present

## 2022-09-29 NOTE — TOC Transition Note (Signed)
Transition of Care [facility]) - CM/SW Discharge Note   Patient Details  Name: Joanna Mendez MRN: [id number] Date of Birth: 1923/12/13  [facility]) CM/SW Contact:  Shade Flood, LCSW Phone Number: 09/29/2022, 10:20 AM   Clinical Narrative:     Pt medically stable for dc per MD. Updated Sharyn Lull at [place] and they will come to transport pt at 1030. DC clinical sent electronically.  Updated pt's son who is in agreement with dc plan. Updated RN.   There are no other TOC needs for dc.  Final next level of care: Assisted Living Barriers to Discharge: Barriers Resolved   Patient Goals and CMS Choice      Discharge Placement                         Discharge Plan and Services Additional resources added to the After Visit Summary for                                       Social Determinants of Health (SDOH) Interventions SDOH Screenings   Food Insecurity: No Food Insecurity (09/27/2022)  Housing: Low Risk  (09/27/2022)  Transportation Needs: No Transportation Needs (09/27/2022)  Utilities: Unknown (09/27/2022)  Tobacco Use: Low Risk  (09/27/2022)     Readmission Risk Interventions     No data to display

## 2022-09-29 NOTE — Progress Notes (Signed)
Chart review; no overnight events.  No changes needed for her discharge summary written on 09/28/22.  Patient is stable to be discharge.  Barton Dubois MD [phone]

## 2022-12-29 ENCOUNTER — Emergency Department (HOSPITAL_COMMUNITY)
Admission: EM | Admit: 2022-12-29 | Discharge: 2022-12-29 | Disposition: A | Discharge status: Home / Self Care | Payer: Medicare Other | Attending: Emergency Medicine | Admitting: Emergency Medicine

## 2022-12-29 ENCOUNTER — Emergency Department (HOSPITAL_COMMUNITY): Payer: Medicare Other

## 2022-12-29 ENCOUNTER — Other Ambulatory Visit: Payer: Self-pay

## 2022-12-29 DIAGNOSIS — F0280 Dementia in other diseases classified elsewhere without behavioral disturbance: Secondary | ICD-10-CM | POA: Diagnosis not present

## 2022-12-29 DIAGNOSIS — G308 Other Alzheimer's disease: Secondary | ICD-10-CM | POA: Insufficient documentation

## 2022-12-29 DIAGNOSIS — S060XAA Concussion with loss of consciousness status unknown, initial encounter: Secondary | ICD-10-CM | POA: Diagnosis not present

## 2022-12-29 DIAGNOSIS — S0990XA Unspecified injury of head, initial encounter: Secondary | ICD-10-CM

## 2022-12-29 DIAGNOSIS — W06XXXA Fall from bed, initial encounter: Secondary | ICD-10-CM | POA: Insufficient documentation

## 2022-12-29 NOTE — ED Notes (Signed)
ED Provider at bedside. 

## 2022-12-29 NOTE — ED Notes (Signed)
Patient transported to CT 

## 2022-12-29 NOTE — ED Notes (Signed)
Stood pt in room and ambulated her with  1 assist.  Did well.   NO complaints

## 2022-12-29 NOTE — ED Triage Notes (Signed)
Staff told EMS that she fell out of bed earlier today.  Pt was asleep in her bed when they arrived.  Has small bump on back on head. No bleeding or lac noted

## 2022-12-29 NOTE — ED Notes (Signed)
Pt discharged without dc vitals due to agitation per MD order.

## 2022-12-29 NOTE — ED Provider Notes (Signed)
[place] EMERGENCY DEPARTMENT AT [facility] Provider Note   CSN: [id number] Arrival date & time: 12/29/22  8469     History  Chief Complaint  Patient presents with   Fall   Level 5 caveat due to dementia Joanna Mendez is a 89 y.o. female.  The history is provided by the EMS personnel and the nursing home.  Patient with history of Alzheimer's presents after unwitnessed fall.  Patient lives in a local nursing facility. It is reported that patient was found on the floor with hematoma to the posterior scalp.  No other signs of trauma.  She is not on anticoagulation. Patient was then placed back in the bed prior to EMS arrival. Patient is at her baseline      Home Medications Prior to Admission medications   Medication Sig Start Date End Date Taking? Authorizing Provider  acetaminophen (TYLENOL) 500 MG tablet Take 500 mg by mouth 3 (three) times daily.    [provider]  ALPRAZolam Prudy Feeler) 0.25 MG tablet Take 0.25 mg by mouth 2 (two) times daily.    [provider]  Calcium Citrate-Vitamin D 250-200 MG-UNIT TABS Take 1 tablet by mouth 3 (three) times daily.    [provider]  Cholecalciferol (VITAMIN D) 2000 units CAPS Take 1 capsule by mouth daily.    [provider]  divalproex (DEPAKOTE) 125 MG DR tablet Take 125 mg by mouth 2 (two) times daily. 01/28/21   [provider]  mirtazapine (REMERON) 30 MG tablet Take 30 mg by mouth at bedtime. 01/28/21   [provider]  polyethylene glycol (MIRALAX) 17 g packet Take 17 g by mouth daily. Hold for diarrhea; please titrate if needed to twice a day in order to achieved good BM daily 09/28/22   [name], MD  senna (SENOKOT) 8.6 MG TABS tablet Take 1 tablet (8.6 mg total) by mouth in the morning and at bedtime. 09/28/22   [name], MD      Allergies    Penicillins    Review of Systems   Review of Systems  Unable to perform ROS: Dementia    Physical  Exam Updated Vital Signs BP (!) 168/90   Pulse (!) 36   Temp 97.9 F (36.6 C) (Axillary)   Resp 16   Ht 1.499 m (4\' 11" )   Wt 52.6 kg   SpO2 (!) 88%   BMI 23.43 kg/m  Physical Exam CONSTITUTIONAL: Elderly and frail HEAD: Hematoma noted to posterior scalp, no other signs of trauma EYES: EOMI/PERRL ENMT: Mucous membranes moist NECK: supple no meningeal signs SPINE/BACK:entire spine nontender No bruising/crepitance/stepoffs noted to spine CV: S1/S2 noted, no murmurs/rubs/gallops noted LUNGS: Lungs are clear to auscultation bilaterally, no apparent distress Chest-no tenderness ABDOMEN: soft, nontender NEURO: Pt is awake and alert but pleasantly confused.  She moves all extremities x 4 without difficulty EXTREMITIES: pulses normal/equal, full ROM Pelvis stable. Full range of motion of both hips without difficulty SKIN: warm, color normal  ED Results / Procedures / Treatments   Labs (all labs ordered are listed, but only abnormal results are displayed) Labs Reviewed - No data to display  EKG None  Radiology CT Head Wo Contrast  Result Date: 12/29/2022 CLINICAL DATA:  89 year old female status post fall from bed. Posterior scalp injury. EXAM: CT HEAD WITHOUT CONTRAST TECHNIQUE: Contiguous axial images were obtained from the base of the skull through the vertex without intravenous contrast. RADIATION DOSE REDUCTION: This exam was performed according to the departmental  dose-optimization program which includes automated exposure control, adjustment of the mA and/or kV according to patient size and/or use of iterative reconstruction technique. COMPARISON:  Head CT 02/05/2021. FINDINGS: Brain: Cerebral volume not significantly changed since 2022. No midline shift, mass effect, or evidence of intracranial mass lesion. No acute intracranial hemorrhage identified. Confluent chronic bilateral cerebral white matter hypodensity. Small chronic infarcts in the bilateral cerebellum. Stable  gray-white matter differentiation throughout the brain. No cortically based acute infarct identified. Vascular: Calcified atherosclerosis at the skull base. No suspicious intracranial vascular hyperdensity. Skull: Stable.  No acute osseous abnormality identified. Sinuses/Orbits: Visualized paranasal sinuses and mastoids are stable and well aerated. Other: Mild right occipital scalp soft tissue swelling series 3, image 25. Underlying occipital bone appears intact, incidental prominent midline occipital arachnoid granulation (normal variant). No scalp soft tissue gas. Second small area of posterior soft tissue swelling along the left convexity series 3, image 44. Underlying calvarium intact. Stable and negative visible orbits. IMPRESSION: 1. Mild multifocal posterior scalp soft tissue injury. No underlying skull fracture. 2. No acute intracranial abnormality. Chronic cerebral white matter disease and small cerebellar infarcts. Electronically Signed   By: Odessa Fleming M.D.   On: 12/29/2022 05:46    Procedures Procedures    Medications Ordered in ED Medications - No data to display  ED Course/ Medical Decision Making/ A&P Clinical Course as of 12/29/22 0554  Thu Dec 29, 2022  1610 Patient stable in the ER.  CT head is negative she has been able to stand.  No other signs of acute traumatic injury.  She is safe for discharge back to facility.  The nursing home to confirm the story.  I have also reached out to her son Gabriel Rung [DW]    Clinical Course User Index [DW] Zadie Rhine, MD                             Medical Decision Making Amount and/or Complexity of Data Reviewed Radiology: ordered.            Final Clinical Impression(s) / ED Diagnoses Final diagnoses:  Injury of head, initial encounter  Concussion with unknown loss of consciousness status, initial encounter    Rx / DC Orders ED Discharge Orders     None         Zadie Rhine, MD 12/29/22 [phone]

## 2023-06-10 IMAGING — CT CT L SPINE W/O CM
3 series · 13 of 33 positions shown, 16 images · non-contrast
Comparison: None available.

CLINICAL DATA: Initial evaluation for persistent low back pain
status post recent fall.

EXAM:
CT LUMBAR SPINE WITHOUT CONTRAST
TECHNIQUE: Multidetector CT imaging of the lumbar spine was performed without
intravenous contrast administration. Multiplanar CT image
reconstructions were also generated.

[Series 5: l spine soft · axial · 0.31mm/px · z∈[-734,-606]mm · 5 of 94 slices shown, 7 images]
[im 15/94  soft-tissue]
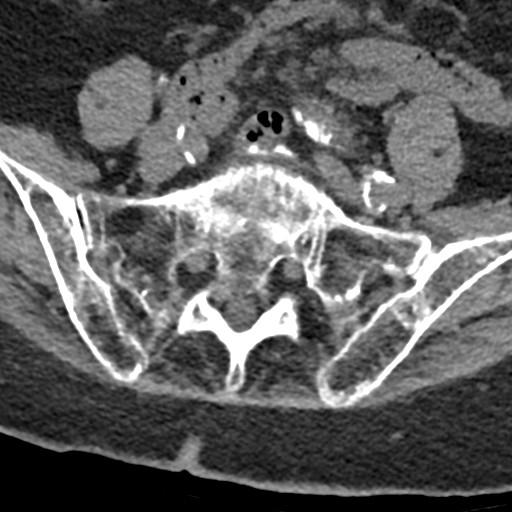
[im 15/94  bone]
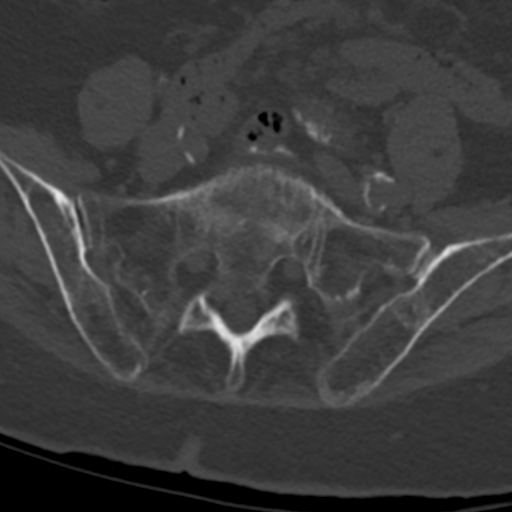
[im 29/94  bone]
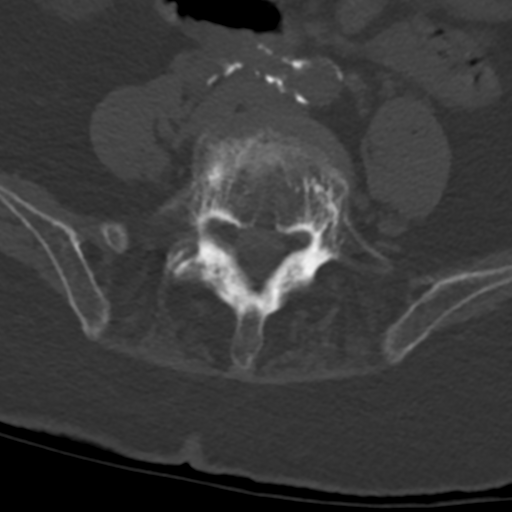
[im 51/94  bone]
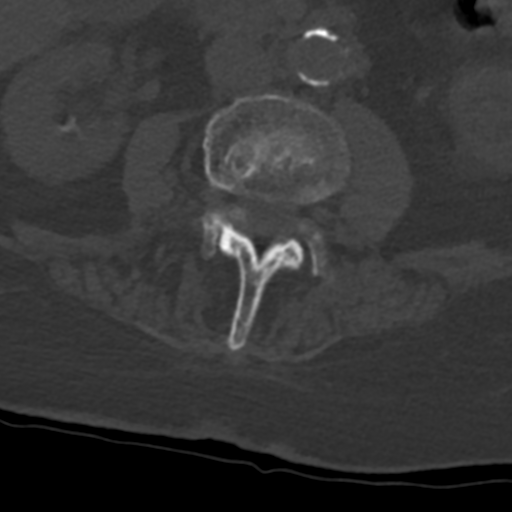
[im 65/94  bone]
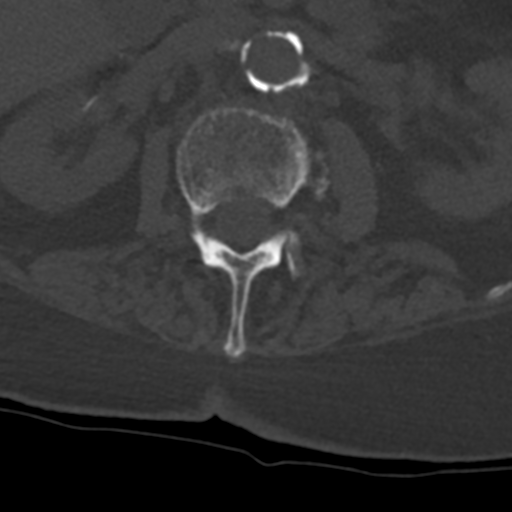
[im 79/94  soft-tissue]
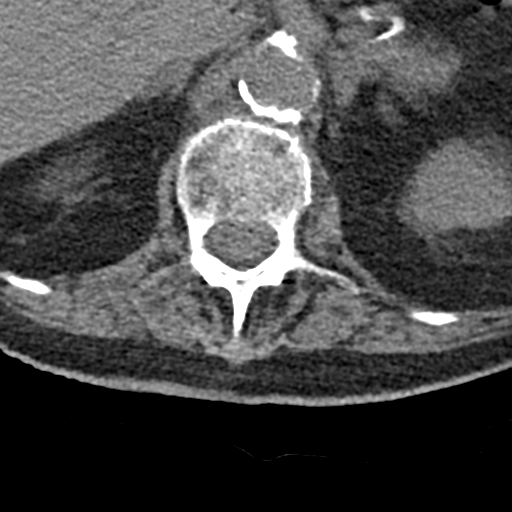
[im 79/94  bone]
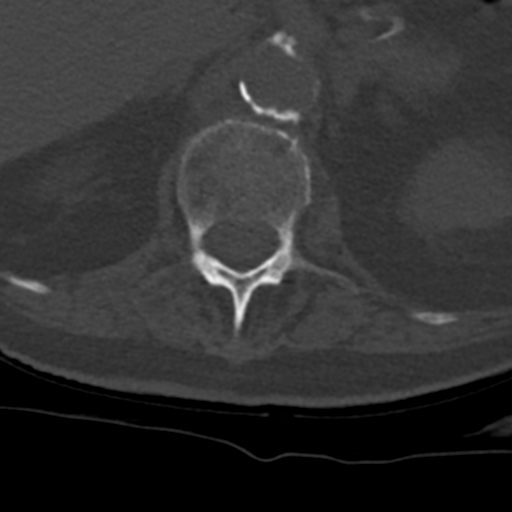

[Series 6: sagittal bone · sagittal · 0.30mm/px · 5 of 77 slices shown, 6 images]
[im 26/77  bone]
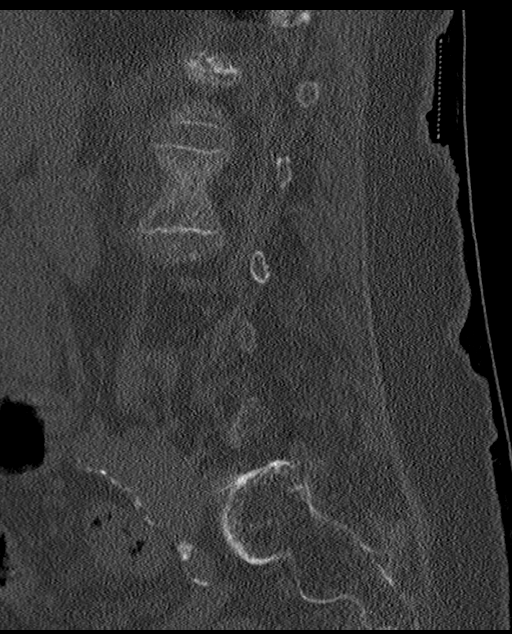
[im 32/77  bone]
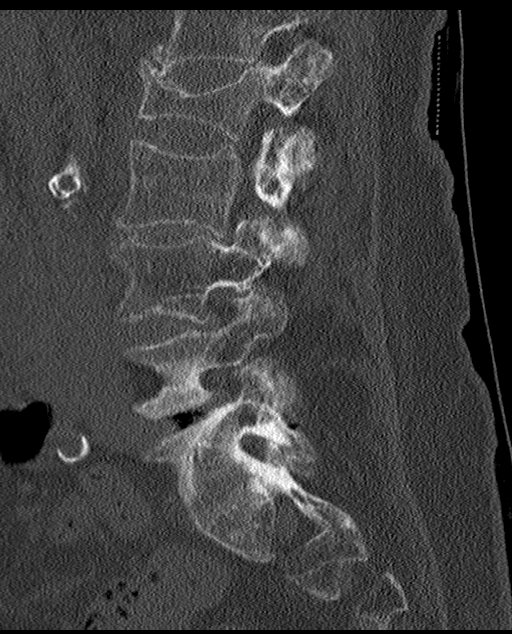
[im 39/77  soft-tissue]
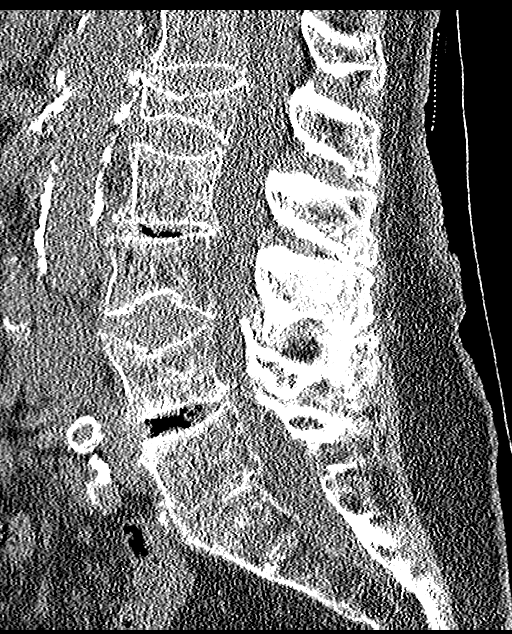
[im 39/77  bone]
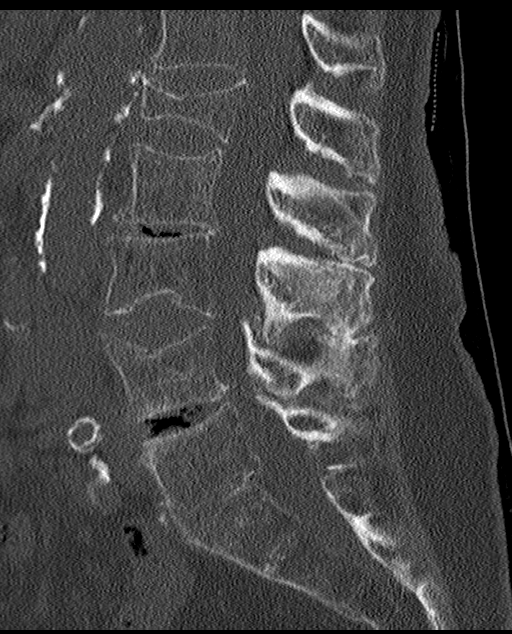
[im 45/77  bone]
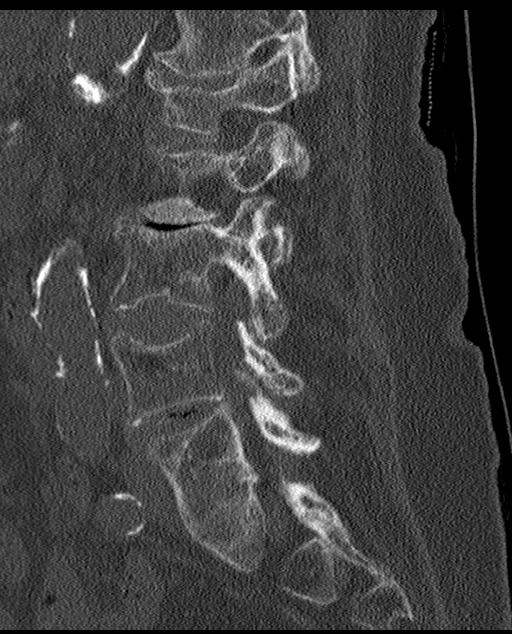
[im 51/77  bone]
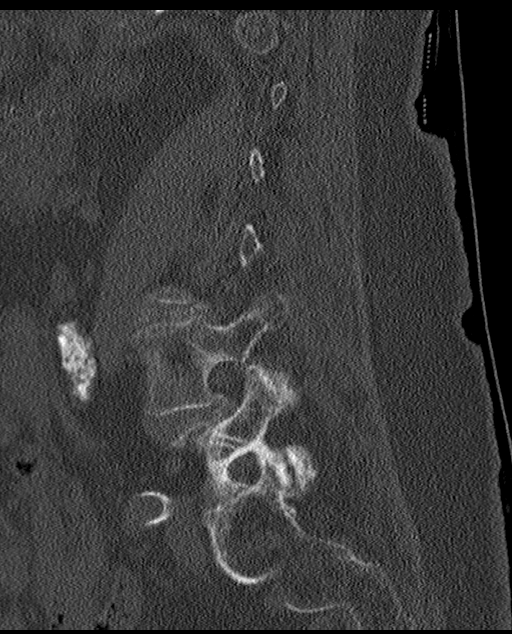

[Series 7: coronal bone · coronal · 0.30mm/px · 3 of 78 slices shown]
[im 16/78  bone]
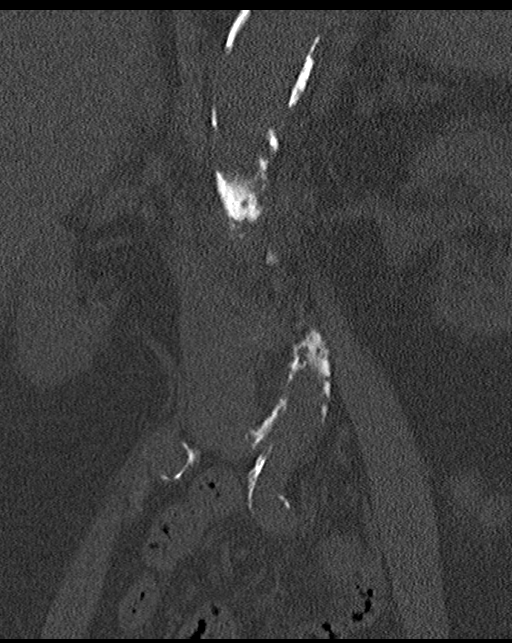
[im 31/78  bone]
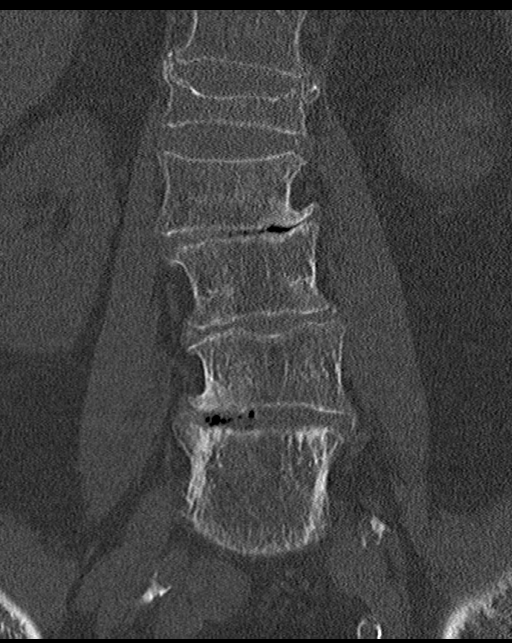
[im 47/78  bone]
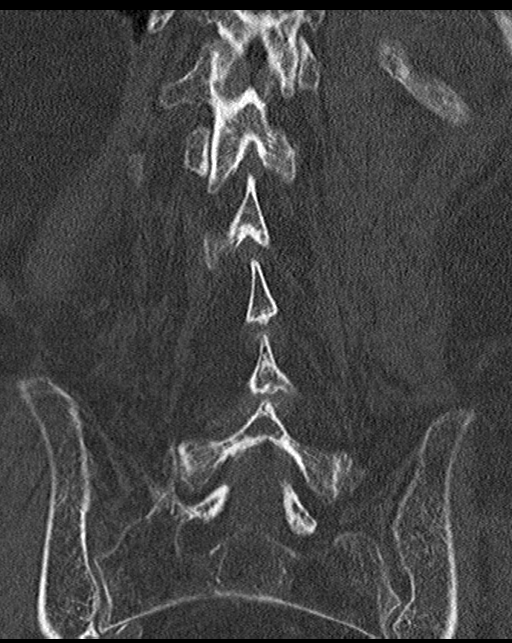

[13 of 33 positions shown; findings below may reference images not displayed]

FINDINGS: Segmentation: Standard. Lowest well-formed disc space labeled the
L5-S1 level.

Alignment: Sigmoid scoliotic curvature of the lumbar spine. 3 mm
chronic retrolisthesis of L5 on S1.

Vertebrae: Subtle lucency seen extending through the superior
endplate of L2, consistent with an acute compression fracture. No
significant height loss or bony retropulsion.

No other visible acute fracture by CT. Chronic L1 compression
fracture with associated 70% height loss and 3 mm bony retropulsion.
Additional chronic compression fracture extending through the
inferior endplate of L3 with 20% height loss without bony
retropulsion. Chronic compression fracture involving the superior
endplate of L4 with mild 20% height loss without bony retropulsion.
No other acute or chronic fracture. Visualized sacrum and pelvis
intact. SI joints symmetric and within normal limits.

Paraspinal and other soft tissues: Subtle paraspinous edema adjacent
to the L2 compression fracture. Paraspinous soft tissues demonstrate
no other acute finding. Advanced aorto bi-iliac atherosclerotic
disease. Nonobstructive right renal nephrolithiasis noted.

Disc levels: T12-L1: 3 mm bony retropulsion related to the chronic
L1 compression fracture. Mild facet hypertrophy. No more than mild
spinal stenosis. Mild left foraminal narrowing.

L1-2:  Negative interspace.  Mild facet hypertrophy.  No stenosis.

L2-3: Degenerative intervertebral disc space narrowing with disc
desiccation and diffuse disc bulge. Mild facet hypertrophy.
Resultant mild spinal stenosis. Moderate right L2 foraminal
narrowing.

L3-4: Mild disc bulge, eccentric to the right. Moderate facet
hypertrophy. Resultant mild spinal stenosis. Mild right L3 foraminal
narrowing.

L4-5: Disc desiccation with diffuse disc bulge. Superimposed right
extraforaminal disc protrusion with reactive endplate change (series
5, image 59). Moderate to advanced bilateral facet hypertrophy.
Resultant moderate canal with narrowing of the lateral recesses,
greater on the right. Moderate right worse than left L4 foraminal
stenosis.

L5-S1: Chronic 3 mm retrolisthesis. L5 and S1 vertebral bodies are
largely ankylosed with associated reactive endplate change. Moderate
facet hypertrophy. No canal or lateral recess stenosis. Moderate
bilateral L5 foraminal narrowing.
IMPRESSION: 1. Acute compression fracture involving the superior endplate of L2
without significant height loss or bony retropulsion.
2. Chronic compression fractures involving the L1, L3, and L4
vertebral bodies as above.
3. Sigmoid scoliotic curvature of the lumbar spine with multilevel
degenerative spondylosis and facet arthrosis as above. Resultant
mild to moderate canal and lateral recess stenosis at L4-5, with
moderate multilevel foraminal narrowing as above.
4. Nonobstructive right renal nephrolithiasis.
5. Aortic Atherosclerosis ([id number].R).

## 2023-06-29 ENCOUNTER — Ambulatory Visit: Payer: Medicare Other | Admitting: Orthopaedic Surgery

## 2023-06-29 ENCOUNTER — Other Ambulatory Visit: Payer: Self-pay

## 2023-06-29 DIAGNOSIS — M25562 Pain in left knee: Secondary | ICD-10-CM

## 2023-06-29 DIAGNOSIS — T847XXD Infection and inflammatory reaction due to other internal orthopedic prosthetic devices, implants and grafts, subsequent encounter: Secondary | ICD-10-CM | POA: Diagnosis not present

## 2023-06-29 DIAGNOSIS — T847XXA Infection and inflammatory reaction due to other internal orthopedic prosthetic devices, implants and grafts, initial encounter: Secondary | ICD-10-CM | POA: Insufficient documentation

## 2023-06-29 NOTE — Progress Notes (Signed)
Office Visit Note   Patient: Joanna Mendez           Date of Birth: [birth year]           MRN: [id number] Visit Date: 06/29/2023              Requested by: Richardean Chimera, MD [address] [place],  [place] [address] PCP: Richardean Chimera, MD   Assessment & Plan: Visit Diagnoses:  1. Left knee pain, unspecified chronicity   2. Hardware complicating wound infection, subsequent encounter     Plan: Will set patient up for hardware removal this require open incision cutting the Steinmann pins and removing the figure-of-eight wire.  I called and discussed with her son outlined treatment plan he is in agreement and would be available for phone consent his phone #[phone].  Patient did have same-day labs.  Discussed in them she be at risk for pneumonia as well as potential death with her older age etc.  Plan to be stay overnight and discharged back to skilled nursing facility the following day.  She would be able to ambulate with a short knee immobilizer.  Follow-Up Instructions: No follow-ups on file.   Orders:  Orders Placed This Encounter  Procedures   XR Knee 1-2 Views Left   No orders of the defined types were placed in this encounter.     Procedures: No procedures performed   Clinical Data: No additional findings.   Subjective: Chief Complaint  Patient presents with   exposed hardware left knee    HPI 89 year old female stays at [facility] in [place] is here with problems with protruding hardware from figure-of-eight fixation of patella fracture sometime prior to 2011 by Dr. Kinnie Scales Plomeritis.  It was noted that the hardware is protruding through her knee K wires.  Her son who lives in [place] is Jasha Brozek phone number is [phone] who is power of attorney/guardian.  I called him and discussed outlined plan.  Review of Systems problems with Alzheimer's acute history of acute renal failure fall COPD chronic kidney disease patella fracture.   Objective: Vital Signs:  There were no vitals taken for this visit.  Physical Exam Constitutional:      Appearance: She is well-developed.     Comments: Patient confused.  HENT:     Head: Normocephalic.     Right Ear: External ear normal.     Left Ear: External ear normal. There is no impacted cerumen.  Eyes:     Pupils: Pupils are equal, round, and reactive to light.  Neck:     Thyroid: No thyromegaly.     Trachea: No tracheal deviation.  Cardiovascular:     Rate and Rhythm: Normal rate.  Pulmonary:     Effort: Pulmonary effort is normal.     Breath sounds: Wheezing and rhonchi present.  Abdominal:     Palpations: Abdomen is soft.  Musculoskeletal:     Cervical back: No rigidity.  Skin:    General: Skin is warm and dry.  Neurological:     Mental Status: She is alert and oriented to person, place, and time.  Psychiatric:        Behavior: Behavior normal.     Ortho Exam patient has protruding hardware to hips this time impingement for great fixation of patella fracture.  Specialty Comments:  No specialty comments available.  Imaging: No results found.   PMFS History: Patient Active Problem List   Diagnosis Date Noted   Hardware complicating  wound infection (HCC) 06/29/2023   Hematochezia 09/28/2022   Weakness    Normocytic anemia 03/21/2018   Near syncope 03/21/2018   Occult GI bleeding 03/21/2018   CKD (chronic kidney disease), stage III (HCC) 03/21/2018   GI bleed 03/21/2018   Anxiety 03/21/2018   COPD (chronic obstructive pulmonary disease) (HCC) 03/21/2018   Anemia    Rectal bleeding 12/30/2013   Chest pain 11/19/2011   Acute renal failure (HCC) 11/19/2011   HTN (hypertension) 11/19/2011   Hyperlipidemia 11/19/2011   Alzheimer disease (HCC) 11/19/2011   Past Medical History:  Diagnosis Date   Allergic rhinitis    Alzheimer disease (HCC)    Anemia    Anorexia    Anxiety    Chronic kidney disease    stage III   COPD (chronic obstructive pulmonary disease) (HCC)     chronic bronchitis   Hyperlipidemia    Hypertension    Osteoarthritis    Osteoporosis    Stroke (HCC) 01/31/2013   TIA   Vitamin D deficiency     Family History  Problem Relation Age of Onset   Dementia Mother    Dementia Maternal Grandmother    Colon cancer Neg Hx     Past Surgical History:  Procedure Laterality Date   ABDOMINAL HYSTERECTOMY     COLONOSCOPY N/A 01/15/2014   Procedure: COLONOSCOPY;  Surgeon: Corbin Ade, MD;  [facility];  Service: Endoscopy;  Laterality: N/A;  8:30AM   KNEE SURGERY     TONSILLECTOMY     Social History   Occupational History   Not on file  Tobacco Use   Smoking status: Never   Smokeless tobacco: Never  Vaping Use   Vaping status: Never Used  Substance and Sexual Activity   Alcohol use: No   Drug use: No   Sexual activity: Not Currently

## 2023-06-30 NOTE — Progress Notes (Signed)
Sent message, via epic in basket, requesting orders in epic from surgeon.  

## 2023-06-30 NOTE — Progress Notes (Signed)
For Anesthesia: PCP - Dr. Beryle Flock Cardiologist - N/A  Chest x-ray - greater than 1 year in [facility] EKG - 09/27/22 in [facility] Stress Test - N/A ECHO - N/A Cardiac Cath - N/A Pacemaker/ICD device last checked: N/A Pacemaker orders received: N/A Device Rep notified: N/A  Spinal Cord Stimulator: N/A  Sleep Study - N/A CPAP - N/A  Fasting Blood Sugar - N/A Checks Blood Sugar __N/A___ times a day Date and result of last Hgb A1c-N/A  Blood Thinner Instructions: N/A Aspirin Instructions: N/A Last Dose: N/A  Activity level:  Able to exercise without chest pain and/or shortness of breath    Anesthesia review: COPD, Stroke, HTN, CKD,  Patient denies shortness of breath, fever, cough and chest pain during pre op phone call.   Patient verbalized understanding of instructions reviewed via telephone.

## 2023-07-03 ENCOUNTER — Other Ambulatory Visit: Payer: Self-pay | Admitting: Physician Assistant

## 2023-07-04 ENCOUNTER — Encounter (HOSPITAL_COMMUNITY): Payer: Self-pay | Admitting: Orthopaedic Surgery

## 2023-07-04 NOTE — Patient Instructions (Addendum)
Preop instructions for:   Valentino Nose   Date of Birth: 1924/01/02                       Date of Procedure: Wednesday, Nov. 20, 2024   Procedure:   HARDWARE REMOVAL LEFT PATELLA [facility] PINS AND WIRE    Surgeon: Dr. Annell Greening Facility contact: [place]    Phone:  [phone]                 Health Care POA: Hilton Sinclair (son) RN contact name/phone#: Alvie Heidelberg or Lelon Mast                         and Fax #: [phone]   Transportation contact phone#: [employer] [phone]  Please send day of procedure:  Current med list  Medications taken the day of procedure (return attached form to hospital) confirm time of nothing by mouth status (return attached form to hospital) Patient Demographic info( to include DNR status, problem list, allergies) Bring Insurance card and picture ID    Time to arrive at [facility]: 10:15 AM   Report to: Admitting (On your left hand side)    Do not eat solid food past midnight the night before your procedure.(To include any tube feedings-must be discontinued)  May have the following until 10:00 AM day of procedure  CLEAR LIQUID DIET  Water Black Coffee (sugar ok, NO MILK/CREAM OR CREAMERS)  Tea (sugar ok, NO MILK/CREAM OR CREAMERS) regular and decaf                             Plain Jell-O (NO RED)                                           Fruit ices (not with fruit pulp, NO RED)                                     Popsicles (NO RED)                                                                  Juice: apple, WHITE grape, WHITE cranberry Sports drinks like Gatorade (NO RED)   Take these morning medications only with sips of water.(or give through gastrostomy or feeding tube).  Claritin Prilosec Depakote Senokot Xanax Tylenol  Stop all vitamins and herbal supplements 7 days before surgery.   Note: No Insulin or Diabetic meds should be given or taken the morning of the procedure!  Oral Hygiene is also  important to reduce your risk of infection.                                    Remember - BRUSH YOUR TEETH THE MORNING OF SURGERY WITH YOUR REGULAR TOOTHPASTE   DENTURES WILL BE REMOVED PRIOR TO SURGERY PLEASE DO NOT APPLY "Poly grip" OR ADHESIVES!!!  Leave all jewelry  and other valuables at place where living( no metal or rings to be worn)  No contact lens  Women-no make-up, no lotions,perfumes,powders   Any questions day of procedure,call  SHORT STAY-[phone]     Sent from :[facility]                   Phone:[phone]                   Fax:[phone]   Sent by : Jayde Mcallister BSN, RN

## 2023-07-05 ENCOUNTER — Encounter (HOSPITAL_COMMUNITY): Payer: Self-pay | Admitting: Orthopaedic Surgery

## 2023-07-05 ENCOUNTER — Ambulatory Visit (HOSPITAL_COMMUNITY): Payer: Self-pay | Admitting: Physician Assistant

## 2023-07-05 ENCOUNTER — Ambulatory Visit (HOSPITAL_COMMUNITY)
Admission: RE | Admit: 2023-07-05 | Discharge: 2023-07-05 | Disposition: A | Discharge status: Skilled Nursing Facility | Payer: Medicare Other | Attending: Orthopaedic Surgery | Admitting: Orthopaedic Surgery

## 2023-07-05 ENCOUNTER — Ambulatory Visit (HOSPITAL_COMMUNITY): Payer: Medicare Other | Admitting: Physician Assistant

## 2023-07-05 ENCOUNTER — Encounter (HOSPITAL_COMMUNITY)
Admission: RE | Disposition: A | Discharge status: Skilled Nursing Facility | Payer: Self-pay | Source: Home / Self Care | Attending: Orthopaedic Surgery

## 2023-07-05 DIAGNOSIS — T84199A Other mechanical complication of internal fixation device of unspecified bone of limb, initial encounter: Secondary | ICD-10-CM | POA: Diagnosis present

## 2023-07-05 DIAGNOSIS — N183 Chronic kidney disease, stage 3 unspecified: Secondary | ICD-10-CM | POA: Insufficient documentation

## 2023-07-05 DIAGNOSIS — G309 Alzheimer's disease, unspecified: Secondary | ICD-10-CM | POA: Diagnosis not present

## 2023-07-05 DIAGNOSIS — T847XXD Infection and inflammatory reaction due to other internal orthopedic prosthetic devices, implants and grafts, subsequent encounter: Secondary | ICD-10-CM | POA: Diagnosis not present

## 2023-07-05 DIAGNOSIS — F419 Anxiety disorder, unspecified: Secondary | ICD-10-CM | POA: Insufficient documentation

## 2023-07-05 DIAGNOSIS — I129 Hypertensive chronic kidney disease with stage 1 through stage 4 chronic kidney disease, or unspecified chronic kidney disease: Secondary | ICD-10-CM | POA: Insufficient documentation

## 2023-07-05 DIAGNOSIS — F0280 Dementia in other diseases classified elsewhere without behavioral disturbance: Secondary | ICD-10-CM | POA: Diagnosis not present

## 2023-07-05 DIAGNOSIS — J449 Chronic obstructive pulmonary disease, unspecified: Secondary | ICD-10-CM | POA: Insufficient documentation

## 2023-07-05 DIAGNOSIS — I10 Essential (primary) hypertension: Secondary | ICD-10-CM

## 2023-07-05 DIAGNOSIS — D649 Anemia, unspecified: Secondary | ICD-10-CM

## 2023-07-05 DIAGNOSIS — Z8673 Personal history of transient ischemic attack (TIA), and cerebral infarction without residual deficits: Secondary | ICD-10-CM | POA: Insufficient documentation

## 2023-07-05 DIAGNOSIS — T84418A Breakdown (mechanical) of other internal orthopedic devices, implants and grafts, initial encounter: Secondary | ICD-10-CM

## 2023-07-05 DIAGNOSIS — Y831 Surgical operation with implant of artificial internal device as the cause of abnormal reaction of the patient, or of later complication, without mention of misadventure at the time of the procedure: Secondary | ICD-10-CM | POA: Diagnosis not present

## 2023-07-05 DIAGNOSIS — F32A Depression, unspecified: Secondary | ICD-10-CM | POA: Insufficient documentation

## 2023-07-05 DIAGNOSIS — T847XXA Infection and inflammatory reaction due to other internal orthopedic prosthetic devices, implants and grafts, initial encounter: Secondary | ICD-10-CM | POA: Diagnosis present

## 2023-07-05 DIAGNOSIS — K219 Gastro-esophageal reflux disease without esophagitis: Secondary | ICD-10-CM | POA: Diagnosis not present

## 2023-07-05 HISTORY — PX: HARDWARE REMOVAL: SHX979

## 2023-07-05 HISTORY — DX: Depression, unspecified: F32.A

## 2023-07-05 HISTORY — DX: Gastro-esophageal reflux disease without esophagitis: K21.9

## 2023-07-05 LAB — CBC
HCT: 36.5 % (ref 36.0–46.0)
Hemoglobin: 11.2 g/dL — ABNORMAL LOW (ref 12.0–15.0)
MCH: 31.9 pg (ref 26.0–34.0)
MCHC: 30.7 g/dL (ref 30.0–36.0)
MCV: 104 fL — ABNORMAL HIGH (ref 80.0–100.0)
Platelets: 194 10*3/uL (ref 150–400)
RBC: 3.51 MIL/uL — ABNORMAL LOW (ref 3.87–5.11)
RDW: 15.5 % (ref 11.5–15.5)
WBC: 6.1 10*3/uL (ref 4.0–10.5)
nRBC: 0 % (ref 0.0–0.2)

## 2023-07-05 LAB — BASIC METABOLIC PANEL
Anion gap: 8 (ref 5–15)
BUN: 23 mg/dL (ref 8–23)
CO2: 28 mmol/L (ref 22–32)
Calcium: 8.6 mg/dL — ABNORMAL LOW (ref 8.9–10.3)
Chloride: 107 mmol/L (ref 98–111)
Creatinine, Ser: 1.2 mg/dL — ABNORMAL HIGH (ref 0.44–1.00)
GFR, Estimated: 41 mL/min — ABNORMAL LOW (ref >60)
Glucose, Bld: 90 mg/dL (ref 70–99)
Potassium: 4.4 mmol/L (ref 3.5–5.1)
Sodium: 143 mmol/L (ref 135–145)

## 2023-07-05 SURGERY — REMOVAL, HARDWARE
Anesthesia: General | Site: Knee | Laterality: Left

## 2023-07-05 MED ORDER — FENTANYL CITRATE PF 50 MCG/ML IJ SOSY: 25.0000 ug | PREFILLED_SYRINGE | INTRAMUSCULAR | Status: DC | PRN

## 2023-07-05 MED ORDER — PROPOFOL 500 MG/50ML IV EMUL
INTRAVENOUS | Status: DC | PRN
  Administered 2023-07-05: 50 ug/kg/min via INTRAVENOUS

## 2023-07-05 MED ORDER — CEFAZOLIN SODIUM-DEXTROSE 2-4 GM/100ML-% IV SOLN: 2.0000 g | INTRAVENOUS | Status: DC

## 2023-07-05 MED ORDER — HYDROCODONE-ACETAMINOPHEN 5-325 MG PO TABS: 1.0000 | ORAL_TABLET | Freq: Three times a day (TID) | ORAL | 0 refills | Status: AC | PRN

## 2023-07-05 MED ORDER — LACTATED RINGERS IV SOLN: INTRAVENOUS | Status: DC

## 2023-07-05 MED ORDER — 0.9 % SODIUM CHLORIDE (POUR BTL) OPTIME
TOPICAL | Status: DC | PRN
  Administered 2023-07-05: 1000 mL

## 2023-07-05 MED ORDER — ORAL CARE MOUTH RINSE: 15.0000 mL | Freq: Once | OROMUCOSAL | Status: DC

## 2023-07-05 MED ORDER — FENTANYL CITRATE (PF) 100 MCG/2ML IJ SOLN
INTRAMUSCULAR | Status: DC | PRN
  Administered 2023-07-05 (×2): 12.5 ug via INTRAVENOUS

## 2023-07-05 MED ORDER — FENTANYL CITRATE (PF) 100 MCG/2ML IJ SOLN
INTRAMUSCULAR | Status: AC
  Filled 2023-07-05: qty 2

## 2023-07-05 MED ORDER — CEFAZOLIN SODIUM-DEXTROSE 1-4 GM/50ML-% IV SOLN
INTRAVENOUS | Status: DC | PRN
  Administered 2023-07-05: 2 g via INTRAVENOUS

## 2023-07-05 MED ORDER — BUPIVACAINE-EPINEPHRINE 0.25% -1:200000 IJ SOLN
INTRAMUSCULAR | Status: DC | PRN
  Administered 2023-07-05: 10 mL

## 2023-07-05 MED ORDER — ACETAMINOPHEN 500 MG PO TABS: 1000.0000 mg | ORAL_TABLET | Freq: Once | ORAL | Status: DC

## 2023-07-05 MED ORDER — BUPIVACAINE-EPINEPHRINE 0.25% -1:200000 IJ SOLN
INTRAMUSCULAR | Status: AC
  Filled 2023-07-05: qty 1

## 2023-07-05 MED ORDER — CHLORHEXIDINE GLUCONATE 0.12 % MT SOLN: 15.0000 mL | Freq: Once | OROMUCOSAL | Status: DC

## 2023-07-05 MED ORDER — SODIUM CHLORIDE (PF) 0.9 % IJ SOLN
INTRAMUSCULAR | Status: AC
  Filled 2023-07-05: qty 10

## 2023-07-05 MED ORDER — PROPOFOL 10 MG/ML IV BOLUS
INTRAVENOUS | Status: DC | PRN
  Administered 2023-07-05: 50 mg via INTRAVENOUS
  Administered 2023-07-05: 70 mg via INTRAVENOUS

## 2023-07-05 MED ORDER — CEFAZOLIN SODIUM 1 G IJ SOLR
INTRAMUSCULAR | Status: AC
  Filled 2023-07-05: qty 20

## 2023-07-05 SURGICAL SUPPLY — 27 items
BAG COUNTER SPONGE SURGICOUNT (BAG) IMPLANT
BAG ZIPLOCK 12X15 (MISCELLANEOUS) ×1 IMPLANT
BNDG ELASTIC 6X10 VLCR STRL LF (GAUZE/BANDAGES/DRESSINGS) IMPLANT
COVER SURGICAL LIGHT HANDLE (MISCELLANEOUS) ×1 IMPLANT
DRAPE STERI IOBAN 125X83 (DRAPES) ×1 IMPLANT
DRSG EMULSION OIL 3X16 NADH (GAUZE/BANDAGES/DRESSINGS) ×1 IMPLANT
ELECT REM PT RETURN 15FT ADLT (MISCELLANEOUS) ×1 IMPLANT
GAUZE PAD ABD 8X10 STRL (GAUZE/BANDAGES/DRESSINGS) ×1 IMPLANT
GAUZE SPONGE 4X4 12PLY STRL (GAUZE/BANDAGES/DRESSINGS) ×1 IMPLANT
GAUZE XEROFORM 1X8 LF (GAUZE/BANDAGES/DRESSINGS) IMPLANT
GLOVE ORTHO TXT STRL SZ7.5 (GLOVE) ×1 IMPLANT
IMMOBILIZER KNEE 20 (SOFTGOODS) ×1
IMMOBILIZER KNEE 20 THIGH 36 (SOFTGOODS) IMPLANT
KIT BASIN OR (CUSTOM PROCEDURE TRAY) ×1 IMPLANT
KIT TURNOVER KIT A (KITS) IMPLANT
NS IRRIG 1000ML POUR BTL (IV SOLUTION) ×1 IMPLANT
PACK GENERAL/GYN (CUSTOM PROCEDURE TRAY) ×1 IMPLANT
PADDING CAST SYNTHETIC 6X4 NS (CAST SUPPLIES) IMPLANT
PROTECTOR NERVE ULNAR (MISCELLANEOUS) ×1 IMPLANT
STAPLER SKIN PROX WIDE 3.9 (STAPLE) IMPLANT
STRIP CLOSURE SKIN 1/2X4 (GAUZE/BANDAGES/DRESSINGS) IMPLANT
SUT ETHILON 2 0 PS N (SUTURE) IMPLANT
SUT MNCRL AB 4-0 PS2 18 (SUTURE) IMPLANT
SUT VIC AB 1 CT1 27XBRD ANTBC (SUTURE) ×1 IMPLANT
SUT VIC AB 2-0 CT1 TAPERPNT 27 (SUTURE) ×1 IMPLANT
TOWEL OR 17X26 10 PK STRL BLUE (TOWEL DISPOSABLE) ×1 IMPLANT
WATER STERILE IRR 1000ML POUR (IV SOLUTION) ×1 IMPLANT

## 2023-07-05 NOTE — Anesthesia Postprocedure Evaluation (Signed)
Anesthesia Post Note  Patient: Joanna Mendez  Procedure(s) Performed: HARDWARE REMOVAL LEFT PATELLA STEINMAN PINS AND WIRE (Left: Knee)     Patient location during evaluation: PACU Anesthesia Type: General Level of consciousness: awake Pain management: pain level controlled Vital Signs Assessment: post-procedure vital signs reviewed and stable Respiratory status: spontaneous breathing, nonlabored ventilation and respiratory function stable Cardiovascular status: blood pressure returned to baseline and stable Postop Assessment: no apparent nausea or vomiting Anesthetic complications: no   No notable events documented.  Last Vitals:  Vitals:   07/05/23 1630 07/05/23 1645  BP: (!) 148/102 (!) 148/102  Pulse: 87   Resp: 19 (!) 26  Temp:  36.7 C  SpO2: 93% 93%    Last Pain:  Vitals:   07/05/23 1137  PainSc: 0-No pain                 Linton Rump

## 2023-07-05 NOTE — Discharge Summary (Addendum)
Physician Discharge Summary  Patient ID: Joanna Mendez MRN: [id number] DOB/AGE: 87/30/1925 87 y.o.  Admit date: 07/05/2023 Discharge date: 07/05/2023  Admission Diagnoses: Protruding hardware right patella  Discharge Diagnoses:  Active Problems:   Hardware complicating wound infection [facility])   Discharged Condition: good  Hospital Course: Patient had open reduction internal fixation patella fracture more than 20 years ago by Dr.Plomeritis performed in [place].  Today's for the admission pain started protruding through the skin her fracture is totally healed and she is brought in for hardware removal. Patient received 2 g Ancef prophylactically at the time of hardware removal. Consults:   Significant Diagnostic Studies:   Treatments: Surgery with removal of hardware and then closure of the wound with nylon sutures 2-O.  Dressing was applied with knee immobilizer which will stay on until she sees me next week.  Knee immobilizer stays on until she is seen in office in 1 week.  Prescription for Norco prescribed 1 p.o. every 8 hours as needed for pain if the Tylenol is not effective.  Discharge Exam: Blood pressure (!) 142/96, pulse 93, temperature 97.6 F (36.4 C), resp. rate 20, height 5\' 2"  (1.575 m), weight 51.3 kg, SpO2 98%.   Disposition: Stable and knee immobilizer.  Knee immobilizer stays on until she sees me in the office in 1 week. No weight bearing restrictions.  Keep immobilizer on to keep pressure off incision with knee bending. DO NOT REMOVE.   Tylenol for pain and norco if tylenol not effective.   Allergies as of 07/05/2023       Reactions   Penicillins    Unknown-not listed on current MAR        Medication List     TAKE these medications    acetaminophen 500 MG tablet Commonly known as: TYLENOL Take 500 mg by mouth 3 (three) times daily. (0800, 1400 & 2000)   ALPRAZolam 0.25 MG tablet Commonly known as: XANAX Take 0.25 mg by mouth 2 (two) times daily. (0800 &  2000)   divalproex 125 MG capsule Commonly known as: DEPAKOTE SPRINKLE Take 125 mg by mouth 2 (two) times daily. (0800 & 2000)   HYDROcodone-acetaminophen 5-325 MG tablet Commonly known as: NORCO/VICODIN Take 1 tablet by mouth every 8 (eight) hours as needed for moderate pain (pain score 4-6).   loratadine 10 MG tablet Commonly known as: CLARITIN Take 10 mg by mouth in the morning. (0900)   mirtazapine 30 MG tablet Commonly known as: REMERON Take 30 mg by mouth at bedtime. (2100)   NUTRITIONAL SUPPLEMENT PO Take 120 mLs by mouth in the morning, at noon, and at bedtime. Medpass 2.0 Liquid (0800, 1400 & 2000)   omeprazole 20 MG tablet Commonly known as: PRILOSEC OTC Take 20 mg by mouth in the morning. (0800)   polyethylene glycol 17 g packet Commonly known as: MIRALAX / GLYCOLAX Take 17 g by mouth daily as needed (constipation/pain.).   senna 8.6 MG Tabs tablet Commonly known as: SENOKOT Take 1 tablet (8.6 mg total) by mouth in the morning and at bedtime.   Vitamin D 50 MCG (2000 UT) Caps Take 2,000 Units by mouth in the morning. (0900)        Follow-up Information     Eldred Manges, MD Follow up in 1 week(s).   Specialty: Orthopedic Surgery Contact information: [address] [place] [address] [phone]                 Signed: Eldred Manges  07/05/2023, 4:04 PM

## 2023-07-05 NOTE — Discharge Instructions (Addendum)
Leave knee immobilizer on until patient seen and in the office with me in 1 week.  She can stand in a wheelchair with the knee extended.  Norco has been prescribed for postop pain 1 p.o. every 8 hours as needed pain.  Call [phone] for making appointment in 1 week from today on a Wednesday in the [place] office.

## 2023-07-05 NOTE — H&P (Signed)
Assessment & Plan: Visit Diagnoses:  1. Left knee pain, unspecified chronicity   2. Hardware complicating wound infection, subsequent encounter       Plan: Will set patient up for hardware removal this require open incision cutting the Steinmann pins and removing the figure-of-eight wire.  I called and discussed with her son outlined treatment plan he is in agreement and would be available for phone consent his phone #[phone].  Patient did have same-day labs.  Discussed in them she be at risk for pneumonia as well as potential death with her older age etc.  Plan to be stay overnight and discharged back to skilled nursing facility the following day.  She would be able to ambulate with a short knee immobilizer.   Follow-Up Instructions: No follow-ups on file.    Orders:     Orders Placed This Encounter  Procedures   XR Knee 1-2 Views Left    No orders of the defined types were placed in this encounter.        Procedures: No procedures performed     Clinical Data: No additional findings.     Subjective:    Chief Complaint  Patient presents with   exposed hardware left knee      HPI 89 year old female stays at [facility] in [place] is here with problems with protruding hardware from figure-of-eight fixation of patella fracture sometime prior to 2011 by Dr. Kinnie Scales Plomeritis.  It was noted that the hardware is protruding through her knee K wires.  Her son who lives in [place] is Cherie Leisy phone number is [phone] who is power of attorney/guardian.  I called him and discussed outlined plan.   Review of Systems problems with Alzheimer's acute history of acute renal failure fall COPD chronic kidney disease patella fracture.     Objective: Vital Signs: There were no vitals taken for this visit.   Physical Exam Constitutional:      Appearance: She is well-developed.     Comments: Patient confused.  HENT:     Head: Normocephalic.     Right Ear: External ear normal.      Left Ear: External ear normal. There is no impacted cerumen.  Eyes:     Pupils: Pupils are equal, round, and reactive to light.  Neck:     Thyroid: No thyromegaly.     Trachea: No tracheal deviation.  Cardiovascular:     Rate and Rhythm: Normal rate.  Pulmonary:     Effort: Pulmonary effort is normal.     Breath sounds: Wheezing and rhonchi present.  Abdominal:     Palpations: Abdomen is soft.  Musculoskeletal:     Cervical back: No rigidity.  Skin:    General: Skin is warm and dry.  Neurological:     Mental Status: She is alert and oriented to person, place, and time.  Psychiatric:        Behavior: Behavior normal.        Ortho Exam patient has protruding hardware to hips this time impingement for great fixation of patella fracture.   Specialty Comments:  No specialty comments available.   Imaging: No results found.     PMFS History:     Patient Active Problem List    Diagnosis Date Noted   Hardware complicating wound infection (HCC) 06/29/2023   Hematochezia 09/28/2022   Weakness     Normocytic anemia 03/21/2018   Near syncope 03/21/2018   Occult GI bleeding 03/21/2018   CKD (chronic kidney disease), stage  III (HCC) 03/21/2018   GI bleed 03/21/2018   Anxiety 03/21/2018   COPD (chronic obstructive pulmonary disease) (HCC) 03/21/2018   Anemia     Rectal bleeding 12/30/2013   Chest pain 11/19/2011   Acute renal failure (HCC) 11/19/2011   HTN (hypertension) 11/19/2011   Hyperlipidemia 11/19/2011   Alzheimer disease (HCC) 11/19/2011        Past Medical History:  Diagnosis Date   Allergic rhinitis     Alzheimer disease (HCC)     Anemia     Anorexia     Anxiety     Chronic kidney disease      stage III   COPD (chronic obstructive pulmonary disease) (HCC)      chronic bronchitis   Hyperlipidemia     Hypertension     Osteoarthritis     Osteoporosis     Stroke (HCC) 01/31/2013    TIA   Vitamin D deficiency               Family History   Problem Relation Age of Onset   Dementia Mother     Dementia Maternal Grandmother     Colon cancer Neg Hx               Past Surgical History:  Procedure Laterality Date   ABDOMINAL HYSTERECTOMY       COLONOSCOPY N/A 01/15/2014    Procedure: COLONOSCOPY;  Surgeon: Corbin Ade, MD;  [facility];  Service: Endoscopy;  Laterality: N/A;  8:30AM   KNEE SURGERY       TONSILLECTOMY            Social History        Occupational History   Not on file  Tobacco Use   Smoking status: Never   Smokeless tobacco: Never  Vaping Use   Vaping status: Never Used  Substance and Sexual Activity   Alcohol use: No   Drug use: No   Sexual activity: Not Currently

## 2023-07-05 NOTE — Interval H&P Note (Signed)
History and Physical Interval Note:  07/05/2023 2:48 PM  Joanna Mendez  has presented today for surgery, with the diagnosis of left patella protruding hardware.  The various methods of treatment have been discussed with the patient and family. After consideration of risks, benefits and other options for treatment, the patient has consented to  Procedure(s): HARDWARE REMOVAL LEFT PATELLA STEINMAN PINS AND WIRE (Left) as a surgical intervention.  The patient's history has been reviewed, patient examined, no change in status, stable for surgery.  I have reviewed the patient's chart and labs.  Questions were answered to the patient's satisfaction.     Eldred Manges

## 2023-07-05 NOTE — Progress Notes (Signed)
I reviewed discharge instructions with Joanna Mendez at [facility] facility via phone.Made son, Jonny Ruiz, aware and facility aware that patient was unable to be dressed in clothes, sent from facility, due to clothes being soaked in urine, both agreed. Gwyndolyn Saxon reviewed discharge instructions with son and caregiver Lelon Mast. Written script for Norco given to caregiver with discharge instructions, nurse at facility aware.

## 2023-07-05 NOTE — Op Note (Signed)
Pre and postop diagnosis: Protruding hardware right patella.  Procedure: removal of Steinmann pins and figure-of-eight wire from right patella (deep implant).  Surgeon: Annell Greening, MD  Anesthesia 10 cc Marcaine plus LMA.  Tourniquet less than 30 minutes.  Brief history 89 year old female 20 years ago had patella fracture fixed by Dr.Plomeritis.  She is in a skilled facility with Alzheimer's and pins have been protruding through the skin with erythema and eschar.  She is brought in for hard removal of patellas completely healed.  Procedure after induction of anesthesia proximal thigh tourniquet leg was prepped with DuraPrep to the ankle impervious stockinette Coban extremity sheets and drapes were applied timeout procedure was completed Ancef prophylaxis was given 2 g.  Leg was elevated tourniquet inflated midline incision was opened.  Stomach pins were cut on the end and then pulled through the patella.  Figure-of-eight wire was identified separate areas cut and then removed with pliers.  There is no purulence in the prepatellar bursa.  Some eschars were removed from the skin copious irrigation tourniquet deflation and closure of the skin with 2-0 nylon.  Postop dressing applied including a knee immobilizer which will stay on until she sees me in the office in 1 week.

## 2023-07-05 NOTE — Transfer of Care (Signed)
Immediate Anesthesia Transfer of Care Note  Patient: Joanna Mendez  Procedure(s) Performed: HARDWARE REMOVAL LEFT PATELLA STEINMAN PINS AND WIRE (Left: Knee)  Patient Location: PACU  Anesthesia Type:General  Level of Consciousness: awake, alert , and confused  Airway & Oxygen Therapy: Patient Spontanous Breathing and Patient connected to face mask oxygen  Post-op Assessment: Report given to RN and Post -op Vital signs reviewed and stable  Post vital signs: Reviewed and stable  Last Vitals:  Vitals Value Taken Time  BP 142/96 07/05/23 1555  Temp 36.4 C 07/05/23 1554  Pulse 84 07/05/23 1601  Resp 22 07/05/23 1601  SpO2 93 % 07/05/23 1601  Vitals shown include unfiled device data.  Last Pain:  Vitals:   07/05/23 1137  PainSc: 0-No pain         Complications: No notable events documented.

## 2023-07-05 NOTE — Anesthesia Preprocedure Evaluation (Addendum)
Anesthesia Evaluation  Patient identified by MRN, date of birth, ID bandGeneral Assessment Comment:Answers questions intermittently  Reviewed: Allergy & Precautions, NPO status , Patient's Chart, lab work & pertinent test results  Airway Mallampati: Unable to assess      Comment: Unable to assess 2/2 pt cooperation Dental  (+) Partial Upper, Dental Advisory Given   Pulmonary COPD   breath sounds clear to auscultation       Cardiovascular hypertension,  Rhythm:Regular Rate:Normal     Neuro/Psych  PSYCHIATRIC DISORDERS Anxiety Depression   Dementia CVA    GI/Hepatic Neg liver ROS,GERD  ,,  Endo/Other  negative endocrine ROS    Renal/GU Renal InsufficiencyRenal disease  negative genitourinary   Musculoskeletal  (+) Arthritis ,    Abdominal   Peds  Hematology negative hematology ROS (+)   Anesthesia Other Findings   Reproductive/Obstetrics                             Anesthesia Physical Anesthesia Plan  ASA: 3  Anesthesia Plan: General   Post-op Pain Management: Tylenol PO (pre-op)*   Induction: Intravenous  PONV Risk Score and Plan: 3 and Ondansetron, Dexamethasone and Treatment may vary due to age or medical condition  Airway Management Planned: LMA  Additional Equipment:   Intra-op Plan:   Post-operative Plan: Extubation in OR  Informed Consent: I have reviewed the patients History and Physical, chart, labs and discussed the procedure including the risks, benefits and alternatives for the proposed anesthesia with the patient or authorized representative who has indicated his/her understanding and acceptance.     Dental advisory given  Plan Discussed with: CRNA  Anesthesia Plan Comments:        Anesthesia Quick Evaluation

## 2023-07-05 NOTE — Progress Notes (Signed)
Patient ID: Joanna Mendez, female   DOB: July 25, 1924, 89 y.o.   MRN: [id number] Rx for norco one po q 8 hrs prn pain sent with pt . OK for WB with knee immobilizer . Office one week. Keep knee immobilizer on. Back to SNF . Tylenol for pain and norco if not effective. Office one week [employer].

## 2023-07-05 NOTE — Anesthesia Procedure Notes (Signed)
Procedure Name: Intubation Date/Time: 07/05/2023 3:15 PM  Performed by: Floydene Flock, CRNAPre-anesthesia Checklist: Patient identified, Emergency Drugs available, Suction available, Patient being monitored and Timeout performed Patient Re-evaluated:Patient Re-evaluated prior to induction Oxygen Delivery Method: Circle system utilized Preoxygenation: Pre-oxygenation with 100% oxygen Induction Type: IV induction Ventilation: Mask ventilation without difficulty LMA: LMA inserted LMA Size: 3.0 Number of attempts: 1 Placement Confirmation: positive ETCO2 and breath sounds checked- equal and bilateral Tube secured with: Tape

## 2023-07-06 ENCOUNTER — Other Ambulatory Visit: Payer: Self-pay

## 2023-07-06 ENCOUNTER — Encounter (HOSPITAL_COMMUNITY): Payer: Self-pay | Admitting: Orthopaedic Surgery

## 2023-07-12 ENCOUNTER — Ambulatory Visit: Payer: Medicare Other | Admitting: Orthopaedic Surgery

## 2023-07-12 DIAGNOSIS — T847XXD Infection and inflammatory reaction due to other internal orthopedic prosthetic devices, implants and grafts, subsequent encounter: Secondary | ICD-10-CM

## 2023-07-12 NOTE — Progress Notes (Addendum)
Post-Op Visit Note   Patient: Joanna Mendez           Date of Birth: May 16, 1924           MRN: [id number] Visit Date: 07/12/2023 PCP: Richardean Chimera, MD   Assessment & Plan: Post hard removal for protruding pins figure-of-eight wiring for patella fracture fixed that greater than 10 years ago.  In for aspect of the wound had some erythema looks slightly soupy and will place her on some doxycycline she is allergic to penicillin.  She did receive Ancef prophylaxis without problems.  100 mg doxycycline p.o. twice daily x 1 week recheck 1 week.  Sutures stayed in and daily the nurse can remove the dressing keeping her knee straight swabbing with Betadine then apply 4 x 4 rewrap Ace and put her back in the knee immobilizer keeping her knee out straight so she will not bend her knee and split her knee incision open. I called her son and discussed exam and plan.  Chief Complaint:  Chief Complaint  Patient presents with   Left Knee - Routine Post Op    07/05/2023   Hardware removal left patella   Visit Diagnoses:  1. Hardware complicating wound infection, subsequent encounter     Plan: Return in 1 week for wound check.  Follow-Up Instructions: No follow-ups on file.   Orders:  No orders of the defined types were placed in this encounter.  No orders of the defined types were placed in this encounter.   Imaging: No results found.  PMFS History: Patient Active Problem List   Diagnosis Date Noted   Hardware complicating wound infection (HCC) 06/29/2023   Hematochezia 09/28/2022   Weakness    Normocytic anemia 03/21/2018   Near syncope 03/21/2018   Occult GI bleeding 03/21/2018   CKD (chronic kidney disease), stage III (HCC) 03/21/2018   GI bleed 03/21/2018   Anxiety 03/21/2018   COPD (chronic obstructive pulmonary disease) (HCC) 03/21/2018   Anemia    Rectal bleeding 12/30/2013   Chest pain 11/19/2011   Acute renal failure (HCC) 11/19/2011   HTN (hypertension) 11/19/2011    Hyperlipidemia 11/19/2011   Alzheimer disease (HCC) 11/19/2011   Past Medical History:  Diagnosis Date   Allergic rhinitis    Alzheimer disease (HCC)    Anemia    Anorexia    Anxiety    Chronic kidney disease    stage III   COPD (chronic obstructive pulmonary disease) (HCC)    chronic bronchitis   Depression    GERD (gastroesophageal reflux disease)    Hyperlipidemia    Hypertension    Osteoarthritis    Osteoporosis    Stroke (HCC) 01/31/2013   TIA   Vitamin D deficiency     Family History  Problem Relation Age of Onset   Dementia Mother    Dementia Maternal Grandmother    Colon cancer Neg Hx     Past Surgical History:  Procedure Laterality Date   ABDOMINAL HYSTERECTOMY     COLONOSCOPY N/A 01/15/2014   Procedure: COLONOSCOPY;  Surgeon: Corbin Ade, MD;  Location: [facility];  Service: Endoscopy;  Laterality: N/A;  8:30AM   HARDWARE REMOVAL Left 07/05/2023   Procedure: HARDWARE REMOVAL LEFT PATELLA STEINMAN PINS AND WIRE;  Surgeon: Eldred Manges, MD;  Location: [facility];  Service: Orthopedics;  Laterality: Left;   KNEE SURGERY     TONSILLECTOMY     Social History   Occupational History   Not on file  Tobacco Use   Smoking status: Never   Smokeless tobacco: Never  Vaping Use   Vaping status: Never Used  Substance and Sexual Activity   Alcohol use: No   Drug use: No   Sexual activity: Not Currently

## 2023-07-19 ENCOUNTER — Encounter: Payer: Self-pay | Admitting: Orthopaedic Surgery

## 2023-07-19 ENCOUNTER — Ambulatory Visit: Payer: Medicare Other | Admitting: Orthopaedic Surgery

## 2023-07-19 VITALS — Ht 62.0 in | Wt 113.0 lb

## 2023-07-19 DIAGNOSIS — T847XXD Infection and inflammatory reaction due to other internal orthopedic prosthetic devices, implants and grafts, subsequent encounter: Secondary | ICD-10-CM

## 2023-07-19 NOTE — Progress Notes (Signed)
Postop hardware removal which is protruding through the skin from patella fracture.  Swelling is down draining is decreased.  Some puffiness of the subcutaneous tissue but no drainage.  We will leave the sutures in for 1 more week return 1 week for probable suture removal.

## 2023-07-26 ENCOUNTER — Encounter: Payer: Self-pay | Admitting: Orthopaedic Surgery

## 2023-07-26 ENCOUNTER — Ambulatory Visit (INDEPENDENT_AMBULATORY_CARE_PROVIDER_SITE_OTHER): Payer: Medicare Other | Admitting: Orthopaedic Surgery

## 2023-07-26 VITALS — Ht 62.0 in | Wt 113.0 lb

## 2023-07-26 DIAGNOSIS — T847XXD Infection and inflammatory reaction due to other internal orthopedic prosthetic devices, implants and grafts, subsequent encounter: Secondary | ICD-10-CM

## 2023-07-26 NOTE — Progress Notes (Signed)
Follow-up for hardware removal patella healed fracture from many years ago for protruding pins and wire.  Sutures are removed today no drainage.  Leave knee immobilizer on for 1 week keep her knee extended and then they can remove the knee immobilizer.  I will recheck her for final visit likely 3 weeks.

## 2023-08-17 ENCOUNTER — Ambulatory Visit: Payer: Medicare Other | Admitting: Orthopaedic Surgery

## 2023-08-17 DIAGNOSIS — T847XXS Infection and inflammatory reaction due to other internal orthopedic prosthetic devices, implants and grafts, sequela: Secondary | ICD-10-CM

## 2023-08-17 NOTE — Progress Notes (Signed)
   Post-Op Visit Note   Patient: Joanna Mendez           Date of Birth: 12-Jul-1924           MRN: [id number] Visit Date: 08/17/2023 PCP: Toribio Jerel MATSU, MD   Assessment & Plan: Incisions healed no drainage no cellulitis.  Knee is dry.  Instructions written for apply lotion to knee daily for 1 week.  Return as needed.  Discontinue knee immobilizer.  Chief Complaint:  Chief Complaint  Patient presents with   Left Knee - Routine Post Op    07/05/2023 hardware removal left patella   Visit Diagnoses:  1. Hardware complicating wound infection, sequela     Plan: Return as needed.  Follow-Up Instructions: No follow-ups on file.   Orders:  No orders of the defined types were placed in this encounter.  No orders of the defined types were placed in this encounter.   Imaging: No results found.  PMFS History: Patient Active Problem List   Diagnosis Date Noted   Hardware complicating wound infection (HCC) 06/29/2023   Hematochezia 09/28/2022   Weakness    Normocytic anemia 03/21/2018   Near syncope 03/21/2018   Occult GI bleeding 03/21/2018   CKD (chronic kidney disease), stage III (HCC) 03/21/2018   GI bleed 03/21/2018   Anxiety 03/21/2018   COPD (chronic obstructive pulmonary disease) (HCC) 03/21/2018   Anemia    Rectal bleeding 12/30/2013   Chest pain 11/19/2011   Acute renal failure (HCC) 11/19/2011   HTN (hypertension) 11/19/2011   Hyperlipidemia 11/19/2011   Alzheimer disease (HCC) 11/19/2011   Past Medical History:  Diagnosis Date   Allergic rhinitis    Alzheimer disease (HCC)    Anemia    Anorexia    Anxiety    Chronic kidney disease    stage III   COPD (chronic obstructive pulmonary disease) (HCC)    chronic bronchitis   Depression    GERD (gastroesophageal reflux disease)    Hyperlipidemia    Hypertension    Osteoarthritis    Osteoporosis    Stroke (HCC) 01/31/2013   TIA   Vitamin D  deficiency     Family History  Problem Relation Age of Onset    Dementia Mother    Dementia Maternal Grandmother    Colon cancer Neg Hx     Past Surgical History:  Procedure Laterality Date   ABDOMINAL HYSTERECTOMY     COLONOSCOPY N/A 01/15/2014   Procedure: COLONOSCOPY;  Surgeon: Lamar CHRISTELLA Hollingshead, MD;  Location: [facility];  Service: Endoscopy;  Laterality: N/A;  8:30AM   HARDWARE REMOVAL Left 07/05/2023   Procedure: HARDWARE REMOVAL LEFT PATELLA STEINMAN PINS AND WIRE;  Surgeon: Barbarann Oneil BROCKS, MD;  Location: [facility];  Service: Orthopedics;  Laterality: Left;   KNEE SURGERY     TONSILLECTOMY     Social History   Occupational History   Not on file  Tobacco Use   Smoking status: Never   Smokeless tobacco: Never  Vaping Use   Vaping status: Never Used  Substance and Sexual Activity   Alcohol use: No   Drug use: No   Sexual activity: Not Currently

## 2023-11-14 DEATH — deceased
